# Patient Record
Sex: Female | Born: 1935 | Race: White | Hispanic: No | Smoking: Former smoker
Health system: Southern US, Community
[De-identification: ages and names within clinical notes are randomized; demographics above are authoritative.]

## PROBLEM LIST (undated history)

## (undated) DIAGNOSIS — Z780 Asymptomatic menopausal state: Secondary | ICD-10-CM

## (undated) DIAGNOSIS — E119 Type 2 diabetes mellitus without complications: Secondary | ICD-10-CM

## (undated) DIAGNOSIS — M810 Age-related osteoporosis without current pathological fracture: Secondary | ICD-10-CM

## (undated) DIAGNOSIS — M1380 Other specified arthritis, unspecified site: Secondary | ICD-10-CM

## (undated) DIAGNOSIS — E785 Hyperlipidemia, unspecified: Secondary | ICD-10-CM

## (undated) DIAGNOSIS — H04129 Dry eye syndrome of unspecified lacrimal gland: Secondary | ICD-10-CM

## (undated) DIAGNOSIS — M1990 Unspecified osteoarthritis, unspecified site: Secondary | ICD-10-CM

## (undated) DIAGNOSIS — I10 Essential (primary) hypertension: Secondary | ICD-10-CM

## (undated) HISTORY — PX: ABDOMINAL HYSTERECTOMY: SHX81

## (undated) HISTORY — DX: Age-related osteoporosis without current pathological fracture: M81.0

## (undated) HISTORY — DX: Dry eye syndrome of unspecified lacrimal gland: H04.129

## (undated) HISTORY — PX: CAPSULOTOMY: SHX379

## (undated) HISTORY — DX: Unspecified osteoarthritis, unspecified site: M19.90

## (undated) HISTORY — PX: APPENDECTOMY: SHX54

## (undated) HISTORY — DX: Asymptomatic menopausal state: Z78.0

## (undated) HISTORY — DX: Other specified arthritis, unspecified site: M13.80

## (undated) HISTORY — DX: Hyperlipidemia, unspecified: E78.5

## (undated) HISTORY — DX: Type 2 diabetes mellitus without complications: E11.9

---

## 2004-03-28 ENCOUNTER — Ambulatory Visit: Payer: Self-pay | Admitting: Internal Medicine

## 2004-10-29 ENCOUNTER — Other Ambulatory Visit: Payer: Self-pay

## 2004-10-29 ENCOUNTER — Ambulatory Visit: Payer: Self-pay | Admitting: Orthopedic Surgery

## 2004-11-04 ENCOUNTER — Ambulatory Visit: Payer: Self-pay | Admitting: Orthopedic Surgery

## 2004-11-18 ENCOUNTER — Ambulatory Visit: Payer: Self-pay | Admitting: Orthopedic Surgery

## 2006-02-17 ENCOUNTER — Ambulatory Visit: Payer: Self-pay | Admitting: Internal Medicine

## 2007-03-24 ENCOUNTER — Ambulatory Visit: Payer: Self-pay | Admitting: Internal Medicine

## 2007-07-01 ENCOUNTER — Ambulatory Visit: Payer: Self-pay | Admitting: Internal Medicine

## 2007-08-12 ENCOUNTER — Ambulatory Visit: Payer: Self-pay | Admitting: Internal Medicine

## 2008-01-18 ENCOUNTER — Ambulatory Visit: Payer: Self-pay | Admitting: Internal Medicine

## 2008-04-12 ENCOUNTER — Ambulatory Visit: Payer: Self-pay | Admitting: Internal Medicine

## 2008-08-24 HISTORY — PX: CATARACT EXTRACTION W/PHACO: SHX586

## 2009-01-18 HISTORY — PX: CATARACT EXTRACTION W/PHACO: SHX586

## 2009-06-26 ENCOUNTER — Ambulatory Visit: Payer: Self-pay | Admitting: Internal Medicine

## 2009-07-13 ENCOUNTER — Ambulatory Visit: Payer: Self-pay | Admitting: Internal Medicine

## 2009-07-18 ENCOUNTER — Ambulatory Visit: Payer: Self-pay | Admitting: Internal Medicine

## 2009-07-24 ENCOUNTER — Ambulatory Visit: Payer: Self-pay | Admitting: Internal Medicine

## 2009-07-26 ENCOUNTER — Ambulatory Visit: Payer: Self-pay | Admitting: Internal Medicine

## 2009-08-09 ENCOUNTER — Ambulatory Visit: Payer: Self-pay | Admitting: Unknown Physician Specialty

## 2009-08-14 ENCOUNTER — Ambulatory Visit: Payer: Self-pay | Admitting: Unknown Physician Specialty

## 2009-08-24 ENCOUNTER — Ambulatory Visit: Payer: Self-pay | Admitting: Internal Medicine

## 2009-08-29 ENCOUNTER — Ambulatory Visit: Payer: Self-pay | Admitting: Internal Medicine

## 2009-11-23 ENCOUNTER — Ambulatory Visit: Payer: Self-pay | Admitting: Internal Medicine

## 2009-11-29 ENCOUNTER — Ambulatory Visit: Payer: Self-pay | Admitting: Cardiothoracic Surgery

## 2009-12-18 ENCOUNTER — Ambulatory Visit: Payer: Self-pay | Admitting: Internal Medicine

## 2009-12-24 ENCOUNTER — Ambulatory Visit: Payer: Self-pay | Admitting: Internal Medicine

## 2010-07-17 ENCOUNTER — Ambulatory Visit: Payer: Self-pay | Admitting: Internal Medicine

## 2010-07-25 ENCOUNTER — Ambulatory Visit: Payer: Self-pay | Admitting: Internal Medicine

## 2010-08-25 ENCOUNTER — Ambulatory Visit: Payer: Self-pay | Admitting: Internal Medicine

## 2010-09-24 ENCOUNTER — Ambulatory Visit: Payer: Self-pay | Admitting: Internal Medicine

## 2011-06-09 ENCOUNTER — Ambulatory Visit: Payer: Self-pay | Admitting: Internal Medicine

## 2012-02-09 ENCOUNTER — Ambulatory Visit: Payer: Self-pay | Admitting: Internal Medicine

## 2014-08-12 ENCOUNTER — Emergency Department: Payer: Self-pay | Admitting: Emergency Medicine

## 2015-06-29 ENCOUNTER — Emergency Department
Admission: EM | Admit: 2015-06-29 | Discharge: 2015-06-30 | Disposition: A | Discharge status: Home / Self Care | Payer: Medicare Other | Attending: Emergency Medicine | Admitting: Emergency Medicine

## 2015-06-29 ENCOUNTER — Encounter: Payer: Self-pay | Admitting: Emergency Medicine

## 2015-06-29 DIAGNOSIS — R51 Headache: Secondary | ICD-10-CM | POA: Insufficient documentation

## 2015-06-29 DIAGNOSIS — Z79899 Other long term (current) drug therapy: Secondary | ICD-10-CM | POA: Diagnosis not present

## 2015-06-29 DIAGNOSIS — Z7982 Long term (current) use of aspirin: Secondary | ICD-10-CM | POA: Insufficient documentation

## 2015-06-29 DIAGNOSIS — E860 Dehydration: Secondary | ICD-10-CM | POA: Diagnosis not present

## 2015-06-29 DIAGNOSIS — Z87891 Personal history of nicotine dependence: Secondary | ICD-10-CM | POA: Diagnosis not present

## 2015-06-29 DIAGNOSIS — R519 Headache, unspecified: Secondary | ICD-10-CM

## 2015-06-29 DIAGNOSIS — R112 Nausea with vomiting, unspecified: Secondary | ICD-10-CM

## 2015-06-29 DIAGNOSIS — R42 Dizziness and giddiness: Secondary | ICD-10-CM | POA: Insufficient documentation

## 2015-06-29 DIAGNOSIS — M549 Dorsalgia, unspecified: Secondary | ICD-10-CM | POA: Insufficient documentation

## 2015-06-29 DIAGNOSIS — I10 Essential (primary) hypertension: Secondary | ICD-10-CM | POA: Diagnosis not present

## 2015-06-29 HISTORY — DX: Essential (primary) hypertension: I10

## 2015-06-29 LAB — CBC
HCT: 40.7 % (ref 35.0–47.0)
Hemoglobin: 13.9 g/dL (ref 12.0–16.0)
MCH: 30.8 pg (ref 26.0–34.0)
MCHC: 34.1 g/dL (ref 32.0–36.0)
MCV: 90.5 fL (ref 80.0–100.0)
PLATELETS: 218 10*3/uL (ref 150–440)
RBC: 4.5 MIL/uL (ref 3.80–5.20)
RDW: 13.3 % (ref 11.5–14.5)
WBC: 9.4 10*3/uL (ref 3.6–11.0)

## 2015-06-29 LAB — COMPREHENSIVE METABOLIC PANEL
ALBUMIN: 4.5 g/dL (ref 3.5–5.0)
ALK PHOS: 74 U/L (ref 38–126)
ALT: 22 U/L (ref 14–54)
AST: 26 U/L (ref 15–41)
Anion gap: 9 (ref 5–15)
BUN: 22 mg/dL — AB (ref 6–20)
CALCIUM: 10 mg/dL (ref 8.9–10.3)
CO2: 26 mmol/L (ref 22–32)
CREATININE: 0.51 mg/dL (ref 0.44–1.00)
Chloride: 102 mmol/L (ref 101–111)
GFR calc Af Amer: >60 mL/min (ref >60)
GFR calc non Af Amer: >60 mL/min (ref >60)
GLUCOSE: 139 mg/dL — AB (ref 65–99)
Potassium: 3.8 mmol/L (ref 3.5–5.1)
SODIUM: 137 mmol/L (ref 135–145)
Total Bilirubin: 1.2 mg/dL (ref 0.3–1.2)
Total Protein: 7.6 g/dL (ref 6.5–8.1)

## 2015-06-29 LAB — TROPONIN I

## 2015-06-29 LAB — LIPASE, BLOOD: Lipase: 37 U/L (ref 11–51)

## 2015-06-29 MED ORDER — SODIUM CHLORIDE 0.9 % IV BOLUS (SEPSIS)
1000.0000 mL | Freq: Once | INTRAVENOUS | Status: AC
  Administered 2015-06-29: 1000 mL via INTRAVENOUS

## 2015-06-29 NOTE — ED Notes (Addendum)
Pt presents to ED via EMS from [facility] with c/o of nausea and vomiting sx. EMS states pt experienced presenting sx this evening approx. 4-6 hrs ago. EMS states pt had x1 vomiting episode today. Pt denies abdominal pain. Pt states she has had accompanying dizziness,light-headedness, and heart burn sx. Pt was given 4 mg of Zofran prior to ED arrival by EMS. Pt arrived to ER alert and oriented x4.

## 2015-06-30 ENCOUNTER — Emergency Department: Payer: Medicare Other

## 2015-06-30 LAB — URINALYSIS COMPLETE WITH MICROSCOPIC (ARMC ONLY)
BILIRUBIN URINE: NEGATIVE
Bacteria, UA: NONE SEEN
GLUCOSE, UA: NEGATIVE mg/dL
HGB URINE DIPSTICK: NEGATIVE
NITRITE: NEGATIVE
PH: 6 (ref 5.0–8.0)
Protein, ur: NEGATIVE mg/dL
SPECIFIC GRAVITY, URINE: 1.019 (ref 1.005–1.030)

## 2015-06-30 LAB — TROPONIN I

## 2015-06-30 LAB — BRAIN NATRIURETIC PEPTIDE: B Natriuretic Peptide: 44 pg/mL (ref 0.0–100.0)

## 2015-06-30 MED ORDER — BUTALBITAL-APAP-CAFFEINE 50-325-40 MG PO TABS
1.0000 | ORAL_TABLET | Freq: Once | ORAL | Status: AC
  Administered 2015-06-30: 1 via ORAL
  Filled 2015-06-30: qty 1

## 2015-06-30 MED ORDER — MECLIZINE HCL 25 MG PO TABS: 25.0000 mg | ORAL_TABLET | Freq: Three times a day (TID) | ORAL | Status: DC | PRN

## 2015-06-30 MED ORDER — MECLIZINE HCL 25 MG PO TABS
25.0000 mg | ORAL_TABLET | Freq: Once | ORAL | Status: AC
  Administered 2015-06-30: 25 mg via ORAL
  Filled 2015-06-30: qty 1

## 2015-06-30 MED ORDER — METOCLOPRAMIDE HCL 5 MG/ML IJ SOLN
10.0000 mg | Freq: Once | INTRAMUSCULAR | Status: AC
  Administered 2015-06-30: 10 mg via INTRAVENOUS
  Filled 2015-06-30: qty 2

## 2015-06-30 MED ORDER — BUTALBITAL-APAP-CAFFEINE 50-325-40 MG PO TABS: 2.0000 | ORAL_TABLET | Freq: Once | ORAL | Status: DC

## 2015-06-30 MED ORDER — DIPHENHYDRAMINE HCL 50 MG/ML IJ SOLN
25.0000 mg | Freq: Once | INTRAMUSCULAR | Status: AC
Start: 2015-06-30 — End: 2015-06-30
  Administered 2015-06-30: 25 mg via INTRAVENOUS
  Filled 2015-06-30: qty 1

## 2015-06-30 NOTE — Discharge Instructions (Signed)
Dizziness Dizziness is a common problem. It is a feeling of unsteadiness or light-headedness. You may feel like you are about to faint. Dizziness can lead to injury if you stumble or fall. Anyone can become dizzy, but dizziness is more common in older adults. This condition can be caused by a number of things, including medicines, dehydration, or illness. HOME CARE INSTRUCTIONS Taking these steps may help with your condition: Eating and Drinking  Drink enough fluid to keep your urine clear or pale yellow. This helps to keep you from becoming dehydrated. Try to drink more clear fluids, such as water.  Do not drink alcohol.  Limit your caffeine intake if directed by your health care provider.  Limit your salt intake if directed by your health care provider. Activity  Avoid making quick movements.  Rise slowly from chairs and steady yourself until you feel okay.  In the morning, first sit up on the side of the bed. When you feel okay, stand slowly while you hold onto something until you know that your balance is fine.  Move your legs often if you need to stand in one place for a long time. Tighten and relax your muscles in your legs while you are standing.  Do not drive or operate heavy machinery if you feel dizzy.  Avoid bending down if you feel dizzy. Place items in your home so that they are easy for you to reach without leaning over. Lifestyle  Do not use any tobacco products, including cigarettes, chewing tobacco, or electronic cigarettes. If you need help quitting, ask your health care provider.  Try to reduce your stress level, such as with yoga or meditation. Talk with your health care provider if you need help. General Instructions  Watch your dizziness for any changes.  Take medicines only as directed by your health care provider. Talk with your health care provider if you think that your dizziness is caused by a medicine that you are taking.  Tell a friend or a family  member that you are feeling dizzy. If he or she notices any changes in your behavior, have this person call your health care provider.  Keep all follow-up visits as directed by your health care provider. This is important. SEEK MEDICAL CARE IF:  Your dizziness does not go away.  Your dizziness or light-headedness gets worse.  You feel nauseous.  You have reduced hearing.  You have new symptoms.  You are unsteady on your feet or you feel like the room is spinning. SEEK IMMEDIATE MEDICAL CARE IF:  You vomit or have diarrhea and are unable to eat or drink anything.  You have problems talking, walking, swallowing, or using your arms, hands, or legs.  You feel generally weak.  You are not thinking clearly or you have trouble forming sentences. It may take a friend or family member to notice this.  You have chest pain, abdominal pain, shortness of breath, or sweating.  Your vision changes.  You notice any bleeding.  You have a headache.  You have neck pain or a stiff neck.  You have a fever.   This information is not intended to replace advice given to you by your health care provider. Make sure you discuss any questions you have with your health care provider.   Document Released: 11/05/2000 Document Revised: 09/26/2014 Document Reviewed: 05/08/2014 Elsevier Interactive Patient Education 2016 [place] Headache Without Cause A headache is pain or discomfort felt around the head or neck area. The  specific cause of a headache may not be found. There are many causes and types of headaches. A few common ones are:  Tension headaches.  Migraine headaches.  Cluster headaches.  Chronic daily headaches. HOME CARE INSTRUCTIONS  Watch your condition for any changes. Take these steps to help with your condition: Managing Pain  Take over-the-counter and prescription medicines only as told by your health care provider.  Lie down in a dark, quiet room when you  have a headache.  If directed, apply ice to the head and neck area:  Put ice in a plastic bag.  Place a towel between your skin and the bag.  Leave the ice on for 20 minutes, 2-3 times per day.  Use a heating pad or hot shower to apply heat to the head and neck area as told by your health care provider.  Keep lights dim if bright lights bother you or make your headaches worse. Eating and Drinking  Eat meals on a regular schedule.  Limit alcohol use.  Decrease the amount of caffeine you drink, or stop drinking caffeine. General Instructions  Keep all follow-up visits as told by your health care provider. This is important.  Keep a headache journal to help find out what may trigger your headaches. For example, write down:  What you eat and drink.  How much sleep you get.  Any change to your diet or medicines.  Try massage or other relaxation techniques.  Limit stress.  Sit up straight, and do not tense your muscles.  Do not use tobacco products, including cigarettes, chewing tobacco, or e-cigarettes. If you need help quitting, ask your health care provider.  Exercise regularly as told by your health care provider.  Sleep on a regular schedule. Get 7-9 hours of sleep, or the amount recommended by your health care provider. SEEK MEDICAL CARE IF:   Your symptoms are not helped by medicine.  You have a headache that is different from the usual headache.  You have nausea or you vomit.  You have a fever. SEEK IMMEDIATE MEDICAL CARE IF:   Your headache becomes severe.  You have repeated vomiting.  You have a stiff neck.  You have a loss of vision.  You have problems with speech.  You have pain in the eye or ear.  You have muscular weakness or loss of muscle control.  You lose your balance or have trouble walking.  You feel faint or pass out.  You have confusion.   This information is not intended to replace advice given to you by your health care  provider. Make sure you discuss any questions you have with your health care provider.   Document Released: 05/12/2005 Document Revised: 01/31/2015 Document Reviewed: 09/04/2014 Elsevier Interactive Patient Education 2016 [employer]  Nausea and Vomiting Nausea is a sick feeling that often comes before throwing up (vomiting). Vomiting is a reflex where stomach contents come out of your mouth. Vomiting can cause severe loss of body fluids (dehydration). Children and elderly adults can become dehydrated quickly, especially if they also have diarrhea. Nausea and vomiting are symptoms of a condition or disease. It is important to find the cause of your symptoms. CAUSES   Direct irritation of the stomach lining. This irritation can result from increased acid production (gastroesophageal reflux disease), infection, food poisoning, taking certain medicines (such as nonsteroidal anti-inflammatory drugs), alcohol use, or tobacco use.  Signals from the brain.These signals could be caused by a headache, heat exposure, an inner ear disturbance,  increased pressure in the brain from injury, infection, a tumor, or a concussion, pain, emotional stimulus, or metabolic problems.  An obstruction in the gastrointestinal tract (bowel obstruction).  Illnesses such as diabetes, hepatitis, gallbladder problems, appendicitis, kidney problems, cancer, sepsis, atypical symptoms of a heart attack, or eating disorders.  Medical treatments such as chemotherapy and radiation.  Receiving medicine that makes you sleep (general anesthetic) during surgery. DIAGNOSIS Your caregiver may ask for tests to be done if the problems do not improve after a few days. Tests may also be done if symptoms are severe or if the reason for the nausea and vomiting is not clear. Tests may include:  Urine tests.  Blood tests.  Stool tests.  Cultures (to look for evidence of infection).  X-rays or other imaging studies. Test results  can help your caregiver make decisions about treatment or the need for additional tests. TREATMENT You need to stay well hydrated. Drink frequently but in small amounts.You may wish to drink water, sports drinks, clear broth, or eat frozen ice pops or gelatin dessert to help stay hydrated.When you eat, eating slowly may help prevent nausea.There are also some antinausea medicines that may help prevent nausea. HOME CARE INSTRUCTIONS   Take all medicine as directed by your caregiver.  If you do not have an appetite, do not force yourself to eat. However, you must continue to drink fluids.  If you have an appetite, eat a normal diet unless your caregiver tells you differently.  Eat a variety of complex carbohydrates (rice, wheat, potatoes, bread), lean meats, yogurt, fruits, and vegetables.  Avoid high-fat foods because they are more difficult to digest.  Drink enough water and fluids to keep your urine clear or pale yellow.  If you are dehydrated, ask your caregiver for specific rehydration instructions. Signs of dehydration may include:  Severe thirst.  Dry lips and mouth.  Dizziness.  Dark urine.  Decreasing urine frequency and amount.  Confusion.  Rapid breathing or pulse. SEEK IMMEDIATE MEDICAL CARE IF:   You have blood or brown flecks (like coffee grounds) in your vomit.  You have black or bloody stools.  You have a severe headache or stiff neck.  You are confused.  You have severe abdominal pain.  You have chest pain or trouble breathing.  You do not urinate at least once every 8 hours.  You develop cold or clammy skin.  You continue to vomit for longer than 24 to 48 hours.  You have a fever. MAKE SURE YOU:   Understand these instructions.  Will watch your condition.  Will get help right away if you are not doing well or get worse.   This information is not intended to replace advice given to you by your health care provider. Make sure you discuss  any questions you have with your health care provider.   Document Released: 05/12/2005 Document Revised: 08/04/2011 Document Reviewed: 10/09/2010 Elsevier Interactive Patient Education [employer].

## 2015-06-30 NOTE — ED Notes (Signed)
Pt verbalized understanding of discharge instructions and [facility] Nurse called. NAD at this time.

## 2015-06-30 NOTE — ED Provider Notes (Signed)
[facility] Emergency Department Provider Note  ____________________________________________  Time seen: Approximately 2346 AM  I have reviewed the triage vital signs and the nursing notes.   HISTORY  Chief Complaint Nausea and Emesis  Patient with history of dementia  HPI Katie Logan is a 82 y.o. female who comes into the hospital today with vomiting and nausea. The patient per EMS started having some nausea and vomiting this evening. Per them she vomited once but then felt dizzy and lightheaded. The patient currently herself is reporting some headache and back pain. She is complaining that she's been here so long that she has now developed a headache as well as some back pain and she is angry. The patient reports she was sent here due to vomiting and she vomited early this morning but is not vomiting anymore. She reports that she is sick and being in the hospital has made her sicker. She reports that her headache is a 10 out of 10 and she now feels worse and she did when she first came in. The patient denies any abdominal pain and reports that her vomiting was more liquid than anything. She reports that she does continue to have some indigestion and feels as though there is something going on in her chest. The patient continually reports that she is angry and frustrated because the bed is uncomfortable. She reports that she just wants to know what's going on. She denies a previous history of headaches.   Past Medical History  Diagnosis Date  . Hypertension     There are no active problems to display for this patient.   Past Surgical History  Procedure Laterality Date  . Appendectomy      Current Outpatient Rx  Name  Route  Sig  Dispense  Refill  . aspirin EC 81 MG tablet   Oral   Take 81 mg by mouth daily.         Marland Kitchen atorvastatin (LIPITOR) 20 MG tablet   Oral   Take 20 mg by mouth daily.         . calcium-vitamin D (OSCAL WITH D) 500-200  MG-UNIT tablet   Oral   Take 2 tablets by mouth daily with breakfast.         . citalopram (CELEXA) 20 MG tablet   Oral   Take 20 mg by mouth daily.         . enalapril (VASOTEC) 10 MG tablet   Oral   Take 10 mg by mouth 2 (two) times daily.         . hydrochlorothiazide (HYDRODIURIL) 25 MG tablet   Oral   Take 25 mg by mouth 2 (two) times daily.         . potassium chloride SA (K-DUR,KLOR-CON) 20 MEQ tablet   Oral   Take 20 mEq by mouth daily.         . meclizine (ANTIVERT) 25 MG tablet   Oral   Take 1 tablet (25 mg total) by mouth 3 (three) times daily as needed for dizziness.   15 tablet   0     Allergies Review of patient's allergies indicates no known allergies.  History reviewed. No pertinent family history.  Social History Social History  Substance Use Topics  . Smoking status: Former Research scientist (life sciences)  . Smokeless tobacco: None  . Alcohol Use: No    Review of Systems Constitutional: No fever/chills Eyes: No visual changes. ENT: No sore throat. Cardiovascular: Denies chest pain. Respiratory:  Denies shortness of breath. Gastrointestinal: Nausea and vomiting with no diarrhea Genitourinary: Negative for dysuria. Musculoskeletal:  back pain. Skin: Negative for rash. Neurological: Headache lightheadedness and dizziness  10-point ROS otherwise negative.  ____________________________________________   PHYSICAL EXAM:  VITAL SIGNS: ED Triage Vitals  Enc Vitals Group     BP 06/29/15 2216 162/88 mmHg     Pulse Rate 06/29/15 2216 81     Resp 06/29/15 2216 13     Temp 06/29/15 2216 98 F (36.7 C)     Temp Source 06/29/15 2216 Oral     SpO2 06/29/15 2216 95 %     Weight 06/29/15 2216 140 lb (63.504 kg)     Height 06/29/15 2216 5\' 1"  (1.549 m)     Head Cir --      Peak Flow --      Pain Score 06/29/15 2217 4     Pain Loc --      Pain Edu? --      Excl. in [place]? --     Constitutional: Alert and oriented. Well appearing and in moderate  distress. Eyes: Conjunctivae are normal. PERRL. EOMI. Head: Atraumatic. Nose: No congestion/rhinnorhea. Mouth/Throat: Mucous membranes are moist.  Oropharynx non-erythematous. Neck: No cervical spine tenderness to palpation. Cardiovascular: Normal rate, regular rhythm. Grossly normal heart sounds.  Good peripheral circulation. Respiratory: Normal respiratory effort.  No retractions. Crackles at both bases and anteriorly Gastrointestinal: Soft and nontender. No distention. Positive bowel sounds Musculoskeletal: No lower extremity tenderness nor edema.  . Neurologic:  Normal speech and language. No gross focal neurologic deficits are appreciated. Cranial nerves II through XII are grossly intact Skin:  Skin is warm, dry and intact.  Psychiatric: Mood and affect are normal.   ____________________________________________   LABS (all labs ordered are listed, but only abnormal results are displayed)  Labs Reviewed  COMPREHENSIVE METABOLIC PANEL - Abnormal; Notable for the following:    Glucose, Bld 139 (*)    BUN 22 (*)    All other components within normal limits  URINALYSIS COMPLETEWITH MICROSCOPIC ([facility] ONLY) - Abnormal; Notable for the following:    Color, Urine YELLOW (*)    APPearance CLEAR (*)    Ketones, ur TRACE (*)    Leukocytes, UA TRACE (*)    Squamous Epithelial / LPF 0-5 (*)    All other components within normal limits  LIPASE, BLOOD  CBC  TROPONIN I  BRAIN NATRIURETIC PEPTIDE  TROPONIN I   ____________________________________________  EKG  ED ECG REPORT I, Loney Hering, the attending physician, personally viewed and interpreted this ECG.   Date: 06/30/2015  EKG Time: 2213  Rate: 83  Rhythm: normal sinus rhythm  Axis: normal  Intervals:none  ST&T Change: none  ____________________________________________  RADIOLOGY  CXR: Borderline hyperinflation with coarse interstitial markings, suspect this is chronic lung disease, however appears progressed  compared to CT 2011  KUB: Negative  CT head and cervical spine: Unremarkable head CT for age ____________________________________________   PROCEDURES  Procedure(s) performed: None  Critical Care performed: No  ____________________________________________   INITIAL IMPRESSION / ASSESSMENT AND PLAN / ED COURSE  Pertinent labs & imaging results that were available during my care of the patient were reviewed by me and considered in my medical decision making (see chart for details).  This is an 82 year old female who comes into the hospital today with some nausea and vomiting. The patient reports that being here for so long she has now developed a headache and back pain. The  nurse reports that he did do orthostatics on the patient and the patient was orthostatic and became tachycardic after standing to the 120s. I will give the patient some gentle hydration and sent her for CT scan of her head given the headache and the vomiting as well as a KUB of her abdomen. I will give the patient some Reglan and Benadryl and reassess the patient once I received the results of her imaging.  The patient did receive some normal saline and her headache had some mild improvement with the Reglan and Benadryl. I didn't give the patient some Fioricet to help completely take away the headache. The patient was then walking around her room and said she did still feel some mild dizziness I will give her some meclizine to help with that. As the patient received the meclizine she was sleeping well and did not have any difficulty. I asked the patient if her daughter would take her home or if she would need an ambulance and the patient reports that her daughter would drive down from [place] to come and pick her up to take her home. The patient is currently sleeping in no acute distress. ____________________________________________   FINAL CLINICAL IMPRESSION(S) / ED DIAGNOSES  Final diagnoses:  Acute nonintractable  headache, unspecified headache type  Dizziness  Non-intractable vomiting with nausea, vomiting of unspecified type  Dehydration      Loney Hering, MD 06/30/15 [phone]

## 2016-10-28 LAB — HEMOGLOBIN A1C: HEMOGLOBIN A1C: 6.9

## 2016-10-28 LAB — TSH: TSH: 1.55 (ref 0.41–5.90)

## 2016-10-30 ENCOUNTER — Other Ambulatory Visit: Payer: Self-pay | Admitting: Internal Medicine

## 2016-10-30 DIAGNOSIS — Z1231 Encounter for screening mammogram for malignant neoplasm of breast: Secondary | ICD-10-CM

## 2016-11-06 ENCOUNTER — Emergency Department
Admission: EM | Admit: 2016-11-06 | Discharge: 2016-11-06 | Disposition: A | Discharge status: Home / Self Care | Payer: Medicare Other | Attending: Emergency Medicine | Admitting: Emergency Medicine

## 2016-11-06 ENCOUNTER — Emergency Department: Payer: Medicare Other

## 2016-11-06 ENCOUNTER — Encounter: Payer: Self-pay | Admitting: Emergency Medicine

## 2016-11-06 DIAGNOSIS — Z87891 Personal history of nicotine dependence: Secondary | ICD-10-CM | POA: Diagnosis not present

## 2016-11-06 DIAGNOSIS — I10 Essential (primary) hypertension: Secondary | ICD-10-CM | POA: Diagnosis not present

## 2016-11-06 DIAGNOSIS — F0390 Unspecified dementia without behavioral disturbance: Secondary | ICD-10-CM | POA: Diagnosis not present

## 2016-11-06 DIAGNOSIS — R911 Solitary pulmonary nodule: Secondary | ICD-10-CM | POA: Insufficient documentation

## 2016-11-06 DIAGNOSIS — N3000 Acute cystitis without hematuria: Secondary | ICD-10-CM | POA: Diagnosis not present

## 2016-11-06 DIAGNOSIS — E119 Type 2 diabetes mellitus without complications: Secondary | ICD-10-CM | POA: Diagnosis not present

## 2016-11-06 DIAGNOSIS — R079 Chest pain, unspecified: Secondary | ICD-10-CM | POA: Diagnosis present

## 2016-11-06 HISTORY — DX: Type 2 diabetes mellitus without complications: E11.9

## 2016-11-06 LAB — BASIC METABOLIC PANEL
ANION GAP: 10 (ref 5–15)
BUN: 34 mg/dL — ABNORMAL HIGH (ref 6–20)
CALCIUM: 10.3 mg/dL (ref 8.9–10.3)
CO2: 19 mmol/L — ABNORMAL LOW (ref 22–32)
Chloride: 103 mmol/L (ref 101–111)
Creatinine, Ser: 0.7 mg/dL (ref 0.44–1.00)
Glucose, Bld: 159 mg/dL — ABNORMAL HIGH (ref 65–99)
POTASSIUM: 4 mmol/L (ref 3.5–5.1)
SODIUM: 132 mmol/L — AB (ref 135–145)

## 2016-11-06 LAB — URINALYSIS, COMPLETE (UACMP) WITH MICROSCOPIC
Bilirubin Urine: NEGATIVE
GLUCOSE, UA: NEGATIVE mg/dL
HGB URINE DIPSTICK: NEGATIVE
KETONES UR: NEGATIVE mg/dL
Nitrite: NEGATIVE
PH: 5 (ref 5.0–8.0)
Protein, ur: NEGATIVE mg/dL
RBC / HPF: NONE SEEN RBC/hpf (ref 0–5)
Specific Gravity, Urine: 1.035 — ABNORMAL HIGH (ref 1.005–1.030)

## 2016-11-06 LAB — CBC
HEMATOCRIT: 37.8 % (ref 35.0–47.0)
HEMOGLOBIN: 12.9 g/dL (ref 12.0–16.0)
MCH: 30.1 pg (ref 26.0–34.0)
MCHC: 34.2 g/dL (ref 32.0–36.0)
MCV: 88.1 fL (ref 80.0–100.0)
Platelets: 317 10*3/uL (ref 150–440)
RBC: 4.29 MIL/uL (ref 3.80–5.20)
RDW: 13.3 % (ref 11.5–14.5)
WBC: 15.6 10*3/uL — AB (ref 3.6–11.0)

## 2016-11-06 LAB — TROPONIN I: Troponin I: <0.03 ng/mL (ref <0.03)

## 2016-11-06 MED ORDER — CEPHALEXIN 500 MG PO CAPS: 500.0000 mg | ORAL_CAPSULE | Freq: Four times a day (QID) | ORAL | 0 refills | Status: AC

## 2016-11-06 MED ORDER — IOPAMIDOL (ISOVUE-370) INJECTION 76%
75.0000 mL | Freq: Once | INTRAVENOUS | Status: AC | PRN
  Administered 2016-11-06: 75 mL via INTRAVENOUS
  Filled 2016-11-06: qty 75

## 2016-11-06 MED ORDER — DEXTROSE 5 % IV SOLN: 1.0000 g | Freq: Once | INTRAVENOUS | Status: DC

## 2016-11-06 MED ORDER — SODIUM CHLORIDE 0.9 % IV BOLUS (SEPSIS)
1000.0000 mL | Freq: Once | INTRAVENOUS | Status: AC
  Administered 2016-11-06: 1000 mL via INTRAVENOUS

## 2016-11-06 MED ORDER — DEXTROSE 5 % IV SOLN
1.0000 g | Freq: Once | INTRAVENOUS | Status: AC
  Administered 2016-11-06: 1 g via INTRAVENOUS
  Filled 2016-11-06: qty 10

## 2016-11-06 MED ORDER — ASPIRIN 81 MG PO CHEW
324.0000 mg | CHEWABLE_TABLET | Freq: Once | ORAL | Status: AC
  Administered 2016-11-06: 324 mg via ORAL
  Filled 2016-11-06: qty 4

## 2016-11-06 NOTE — ED Triage Notes (Signed)
Pt to ED via POV from [facility] with c/o CP x2 days. Describes pain as intermittent and radiating to back. Pt VS stable.

## 2016-11-06 NOTE — ED Notes (Signed)
Patient transported to CT 

## 2016-11-06 NOTE — Discharge Instructions (Signed)
Please make an appointment with your primary care physician and with a cardiologist for reevaluation. Today, a nodule in your left lung was seen on the CT scan, which may need reevaluation. It is likely benign (not cancerous) but does need to be monitored. Your primary care physician can help you with that.  Take the entire course of antibiotics, even if you are feeling better.  Return to the emergency department if you develop chest pain, shortness of breath, lightheadedness or fainting, palpitations, fever, or any other symptoms concerning to you.

## 2016-11-06 NOTE — ED Notes (Signed)
Called pharmacy to request medication 

## 2016-11-06 NOTE — ED Provider Notes (Signed)
[facility] Emergency Department Provider Note  ____________________________________________  Time seen: Approximately 7:36 PM  I have reviewed the triage vital signs and the nursing notes.   HISTORY  Chief Complaint Chest Pain  Limited due to patient's dementia.  HPI Katie Logan is a 83 y.o. female with a history of dementia, HTN, DM, presenting with chest pain. The patient is a poor historian and is accompanied by her caregiver from [facility].Yesterday, while the patient was lying in bed, she complained of a substernal "pressure" that did not radiate and was not associated with any shortness of breath, palpitations, syncope, diaphoresis, nausea or vomiting. She states the pain has been persistent since then, but now the "pain" is gone but she still has a "dull ache." This is worse if she takes deep breaths. She does not notice any difference with exertion. She has also had one month of postural lightheadedness.  Her caregiver also states that she has had some mild increase in her confusion, so she went to urgent care today and has a paper which shows a UA with positive nitrites and white blood cells. Denies any dysuria, fever or chills, nausea or vomiting   Past Medical History:  Diagnosis Date  . Diabetes mellitus without complication ([place])   . Hypertension     There are no active problems to display for this patient.   Past Surgical History:  Procedure Laterality Date  . APPENDECTOMY      Current Outpatient Rx  . Order #: [id number] Class: Historical Med  . Order #: [id number] Class: Historical Med  . Order #: [id number] Class: Historical Med  . Order #: [id number] Class: Print  . Order #: [id number] Class: Historical Med  . Order #: [id number] Class: Historical Med  . Order #: [id number] Class: Historical Med  . Order #: [id number] Class: Print  . Order #: [id number] Class: Historical Med    Allergies Patient has no known  allergies.  No family history on file.  Social History Social History  Substance Use Topics  . Smoking status: Former Research scientist (life sciences)  . Smokeless tobacco: Never Used  . Alcohol use No    Review of Systems Constitutional: No fever/chills.Slight increase in her baseline confusion. Eyes: No visual changes. ENT: No sore throat. No congestion or rhinorrhea. Cardiovascular: Positive chest pain. Denies palpitations. Respiratory: Denies shortness of breath.  No cough. Gastrointestinal: No abdominal pain.  No nausea, no vomiting.  No diarrhea.  No constipation. Genitourinary: Negative for dysuria. Musculoskeletal: Negative for back pain. Skin: Negative for rash. Neurological: Negative for headaches. No focal numbness, tingling or weakness.     ____________________________________________   PHYSICAL EXAM:  VITAL SIGNS: ED Triage Vitals  Enc Vitals Group     BP 11/06/16 1746 (!) 151/80     Pulse Rate 11/06/16 1746 77     Resp 11/06/16 1746 16     Temp 11/06/16 1746 98.1 F (36.7 C)     Temp Source 11/06/16 1746 Oral     SpO2 11/06/16 1746 98 %     Weight 11/06/16 1746 140 lb (63.5 kg)     Height 11/06/16 1746 4\' 11"  (1.499 m)     Head Circumference --      Peak Flow --      Pain Score 11/06/16 1918 7     Pain Loc --      Pain Edu? --      Excl. in [place]? --     Constitutional: Alert and oriented. Well appearing and in no acute distress. Answers questions  appropriately. Eyes: Conjunctivae are normal.  EOMI. No scleral icterus. Head: Atraumatic. Nose: No congestion/rhinnorhea. Mouth/Throat: Mucous membranes are moist.  Neck: No stridor.  Supple.  No JVD. Cardiovascular: Normal rate, regular rhythm. No murmurs, rubs or gallops.  Respiratory: Normal respiratory effort.  No accessory muscle use or retractions. Lungs CTAB.  No wheezes, rales or ronchi. Gastrointestinal: Soft, nontender and nondistended.  No guarding or rebound.  No peritoneal signs. Musculoskeletal: No LE edema. No  ttp in the calves or palpable cords.  Negative Homan's sign. Neurologic:  alert but does exhibit signs of dementia with repetitive questioning.Marland Kitchen  Speech is clear.  Face and smile are symmetric.  EOMI.  Moves all extremities well. Skin:  Skin is warm, dry and intact. No rash noted. Psychiatric: Mood and affect are normal.   ____________________________________________   LABS (all labs ordered are listed, but only abnormal results are displayed)  Labs Reviewed  BASIC METABOLIC PANEL - Abnormal; Notable for the following:       Result Value   Sodium 132 (*)    CO2 19 (*)    Glucose, Bld 159 (*)    BUN 34 (*)    All other components within normal limits  CBC - Abnormal; Notable for the following:    WBC 15.6 (*)    All other components within normal limits  URINE CULTURE  TROPONIN I  TROPONIN I  URINALYSIS, COMPLETE (UACMP) WITH MICROSCOPIC   ____________________________________________  EKG  ED ECG REPORT I, Eula Listen, the attending physician, personally viewed and interpreted this ECG.   Date: 11/06/2016  EKG Time: 1745  Rate: 86  Rhythm: normal sinus rhythm  Axis: normal  Intervals:none  ST&T Change: No STEMI  ____________________________________________  RADIOLOGY  Dg Chest 2 View  Result Date: 11/06/2016 CLINICAL DATA:  Chest pain radiating into the back for the past 2 days intermittently. EXAM: CHEST  2 VIEW COMPARISON:  06/30/2015. FINDINGS: Normal sized heart. Clear lungs. Stable prominence of the interstitial markings with no pleural fluid. Unremarkable bones. IMPRESSION: No acute abnormality.  Stable chronic interstitial lung disease. Electronically Signed   By: Claudie Revering M.D.   On: 11/06/2016 18:20   Ct Angio Chest Pe W Or Wo Contrast  Result Date: 11/06/2016 CLINICAL DATA:  Chest pain for 2 days. EXAM: CT ANGIOGRAPHY CHEST WITH CONTRAST TECHNIQUE: Multidetector CT imaging of the chest was performed using the standard protocol during bolus  administration of intravenous contrast. Multiplanar CT image reconstructions and MIPs were obtained to evaluate the vascular anatomy. CONTRAST:  75 cc Isovue 370 COMPARISON:  11/29/2009 FINDINGS: Cardiovascular: Heart size upper normal. No pericardial effusion. Coronary artery calcification is noted. Atherosclerotic calcification is noted in the wall of the thoracic aorta. No filling defect within the opacified pulmonary arteries to indicate the presence of an acute pulmonary embolus. Mediastinum/Nodes: Small lymph nodes are seen scattered in the mediastinum without lymphadenopathy. Calcified lymph nodes are evident in the right hilum. There is no hilar lymphadenopathy. The esophagus has normal imaging features. Tiny hiatal hernia noted. There is no axillary lymphadenopathy. Lungs/Pleura: Chronic interstitial changes are noted in the lung bases similar to prior. Associated peripheral lower lobe bronchiectasis is similar. 2.2 cm nodule in the superior segment left lower lobe measured 1.5 cm on the prior study. No pulmonary edema or pleural effusion. No focal airspace consolidation to suggest pneumonia. Upper Abdomen: 3.2 cm lesion upper pole right kidney is incompletely visualized but approaches water attenuation and is likely a cyst. Musculoskeletal: Bone windows reveal no worrisome  lytic or sclerotic osseous lesions. Review of the MIP images confirms the above findings. IMPRESSION: 1. No CT evidence of acute pulmonary embolus. 2. 2.2 cm nodule in the superior segment left lower lobe was 1.5 cm on the study from 7 years ago. Given the relatively slow interval progression, this is likely benign etiology although it cannot be characterized on today's study. This nodule showed no hypermetabolism on PET-CT of 07/26/2009. 3. Stable appearance chronic interstitial changes. Electronically Signed   By: Misty Stanley M.D.   On: 11/06/2016 20:41     ____________________________________________   PROCEDURES  Procedure(s) performed: None  Procedures  Critical Care performed: No ____________________________________________   INITIAL IMPRESSION / ASSESSMENT AND PLAN / ED COURSE  Pertinent labs & imaging results that were available during my care of the patient were reviewed by me and considered in my medical decision making (see chart for details).  83 y.o. female with a history of HTN and DM presenting with pleuritic chest pain. Overall, the patient is well-appearing and has no evidence of ACS or MI today. Her EKG does not show ischemic changes, her troponin is negative, and her chest x-ray does not show any acute cardiopulmonary process; her cardio pulmonary examination is normal. PE is less likely given that her oxygen saturation is normal, her heart rate is normal, and she has no evidence of DVT; however, given the pleuritic nature of her chest pain and otherwise normal workup, we will get a CT angiogram to evaluate for PE. The patient has no signs or symptoms consistent with pneumonia. She does have a UTI, and an elevated serum wbc, and we'll repeat a urine here so can send it for culture; I have ordered a dose of Rocephin. Plan reevaluation for final disposition.  ____________________________________________  FINAL CLINICAL IMPRESSION(S) / ED DIAGNOSES  Final diagnoses:  Nodule of left lung  Chest pain, unspecified type  Acute cystitis without hematuria         NEW MEDICATIONS STARTED DURING THIS VISIT:  New Prescriptions   CEPHALEXIN (KEFLEX) 500 MG CAPSULE    Take 1 capsule (500 mg total) by mouth 4 (four) times daily.      Eula Listen, MD 11/06/16 2117

## 2016-11-07 ENCOUNTER — Encounter
Admission: RE | Admit: 2016-11-07 | Discharge: 2016-11-07 | Disposition: A | Discharge status: Home / Self Care | Payer: Medicare Other | Source: Ambulatory Visit | Attending: Internal Medicine | Admitting: Internal Medicine

## 2016-11-09 LAB — URINE CULTURE: Culture: >=100000 — AB

## 2016-11-10 ENCOUNTER — Other Ambulatory Visit
Admission: RE | Admit: 2016-11-10 | Discharge: 2016-11-10 | Disposition: A | Discharge status: Home / Self Care | Payer: Medicare Other | Source: Ambulatory Visit | Attending: Gerontology | Admitting: Gerontology

## 2016-11-10 DIAGNOSIS — D72829 Elevated white blood cell count, unspecified: Secondary | ICD-10-CM | POA: Diagnosis present

## 2016-11-10 DIAGNOSIS — E871 Hypo-osmolality and hyponatremia: Secondary | ICD-10-CM | POA: Diagnosis present

## 2016-11-10 DIAGNOSIS — Z87440 Personal history of urinary (tract) infections: Secondary | ICD-10-CM | POA: Insufficient documentation

## 2016-11-10 LAB — CBC WITH DIFFERENTIAL/PLATELET
BASOS PCT: 0 %
Basophils Absolute: 0 10*3/uL (ref 0–0.1)
Eosinophils Absolute: 0 10*3/uL (ref 0–0.7)
Eosinophils Relative: 0 %
HEMATOCRIT: 39.4 % (ref 35.0–47.0)
HEMOGLOBIN: 13.2 g/dL (ref 12.0–16.0)
LYMPHS ABS: 3.1 10*3/uL (ref 1.0–3.6)
LYMPHS PCT: 28 %
MCH: 30.1 pg (ref 26.0–34.0)
MCHC: 33.5 g/dL (ref 32.0–36.0)
MCV: 89.9 fL (ref 80.0–100.0)
MONOS PCT: 6 %
Monocytes Absolute: 0.7 10*3/uL (ref 0.2–0.9)
NEUTROS ABS: 7 10*3/uL — AB (ref 1.4–6.5)
NEUTROS PCT: 66 %
Platelets: 307 10*3/uL (ref 150–440)
RBC: 4.39 MIL/uL (ref 3.80–5.20)
RDW: 13.3 % (ref 11.5–14.5)
WBC: 10.8 10*3/uL (ref 3.6–11.0)

## 2016-11-10 LAB — COMPREHENSIVE METABOLIC PANEL
ALBUMIN: 4.3 g/dL (ref 3.5–5.0)
ALK PHOS: 60 U/L (ref 38–126)
ALT: 23 U/L (ref 14–54)
ANION GAP: 11 (ref 5–15)
AST: 19 U/L (ref 15–41)
BILIRUBIN TOTAL: 1 mg/dL (ref 0.3–1.2)
BUN: 25 mg/dL — AB (ref 6–20)
CALCIUM: 9.5 mg/dL (ref 8.9–10.3)
CO2: 18 mmol/L — ABNORMAL LOW (ref 22–32)
Chloride: 106 mmol/L (ref 101–111)
Creatinine, Ser: 0.67 mg/dL (ref 0.44–1.00)
GFR calc Af Amer: >60 mL/min (ref >60)
GFR calc non Af Amer: >60 mL/min (ref >60)
Glucose, Bld: 204 mg/dL — ABNORMAL HIGH (ref 65–99)
Potassium: 4.6 mmol/L (ref 3.5–5.1)
Sodium: 135 mmol/L (ref 135–145)
TOTAL PROTEIN: 7.3 g/dL (ref 6.5–8.1)

## 2016-11-18 ENCOUNTER — Non-Acute Institutional Stay (SKILLED_NURSING_FACILITY): Payer: Medicare Other | Admitting: Gerontology

## 2016-11-18 DIAGNOSIS — N390 Urinary tract infection, site not specified: Secondary | ICD-10-CM

## 2016-11-19 ENCOUNTER — Encounter: Payer: Self-pay | Admitting: Gerontology

## 2016-11-19 ENCOUNTER — Other Ambulatory Visit
Admission: RE | Admit: 2016-11-19 | Discharge: 2016-11-19 | Disposition: A | Discharge status: Home / Self Care | Payer: Medicare Other | Source: Ambulatory Visit | Attending: Internal Medicine | Admitting: Internal Medicine

## 2016-11-19 DIAGNOSIS — R410 Disorientation, unspecified: Secondary | ICD-10-CM | POA: Insufficient documentation

## 2016-11-19 LAB — URINALYSIS, COMPLETE (UACMP) WITH MICROSCOPIC
BILIRUBIN URINE: NEGATIVE
Bacteria, UA: NONE SEEN
Glucose, UA: >=500 mg/dL — AB
HGB URINE DIPSTICK: NEGATIVE
Ketones, ur: 5 mg/dL — AB
Leukocytes, UA: NEGATIVE
NITRITE: NEGATIVE
PH: 5 (ref 5.0–8.0)
Protein, ur: NEGATIVE mg/dL
RBC / HPF: NONE SEEN RBC/hpf (ref 0–5)
SPECIFIC GRAVITY, URINE: 1.029 (ref 1.005–1.030)
WBC, UA: NONE SEEN WBC/hpf (ref 0–5)

## 2016-11-19 NOTE — Progress Notes (Signed)
Location:   [facility]:  SNF [phone]) Provider:  Toni Arthurs, NP-C  [name], MD  Patient Care Team: [name], MD as PCP - General (Internal Medicine)  Extended Emergency Contact Information Primary Emergency Contact: Irwin,Cheryl E Address: [address]          [place], [place] [id number] Johnnette Litter of [place] Phone: [phone] Mobile Phone: [phone] Relation: Daughter Secondary Emergency Contact: Philippa Sicks [place] of [place] Phone: [phone] Mobile Phone: [phone] Relation: Daughter  Code Status:  Full  Goals of care: Advanced Directive information Advanced Directives 11/19/2016  Does Patient Have a Medical Advance Directive? No  Would patient like information on creating a medical advance directive? -     Chief Complaint  Patient presents with  . Medical management of chronic illnesses    Routine visit    HPI:  Pt is a 83 y.o. female seen today for Medical management of chronic illnesses. Pt was admitted to the facility for rehab/skilled nursing after diagnosis of UTI. Pt continues on antibiotics for the infection. No adverse effects. After antibiotics are complete and pt is stable, she will be moving to Assisted Living. Daughter has requested some PT/OT d/t weakness, deconditioning. Pt denies n/v/d/f/c/cp/sob/ha/abd pain/dizziness/cough/pain/CV tenderness. VSS. No other complaints.     Past Medical History:  Diagnosis Date  . Diabetes mellitus without complication ([place])   . Hypertension    Past Surgical History:  Procedure Laterality Date  . APPENDECTOMY      Allergies  Allergen Reactions  . Epinephrine Other (See Comments)    Other Reaction: Passed out after dental inject  . Latex Other (See Comments)    Other Reaction: angioedema, mouth burns  . Levofloxacin     Other reaction(s): Hallucination  . Other Other (See Comments)    Uncoded Allergy. Allergen: ENVIRONMENTAL ALLERGIES     Allergies as of 11/18/2016   No Known Allergies     Medication List       Accurate as of 11/18/16 11:59 PM. Always use your most recent med list.          atorvastatin 20 MG tablet Commonly known as:  LIPITOR Take 20 mg by mouth daily. 8 am   dexamethasone 0.5 MG/5ML solution Commonly known as:  DECADRON Take 0.5 mg by mouth 4 (four) times daily. Rinse for one full minute and spit out.  8 am, 1 pm, 5 pm, 9 pm   enalapril 20 MG tablet Commonly known as:  VASOTEC Take 20 mg by mouth 2 (two) times daily. 8 am and 5 pm   levothyroxine 50 MCG tablet Commonly known as:  SYNTHROID, LEVOTHROID Take 50 mcg by mouth daily before breakfast. 6 am   metFORMIN 500 MG tablet Commonly known as:  GLUCOPHAGE Take 500 mg by mouth 2 (two) times daily with a meal. 8 am and 5 pm   potassium chloride 10 MEQ tablet Commonly known as:  K-DUR,KLOR-CON Take 10 mEq by mouth daily. 8 am   rivastigmine 9.5 mg/24hr Commonly known as:  EXELON Place 9.5 mg onto the skin daily. 8 am   TYLENOL ARTHRITIS PAIN 650 MG CR tablet Generic drug:  acetaminophen Take 650 mg by mouth every 8 (eight) hours as needed for pain. 6 am , 2 pm and 10 pm       Review of Systems  Constitutional: Negative for activity change, appetite change, chills, diaphoresis and fever.  HENT: Negative for congestion, sneezing, sore throat, trouble  swallowing and voice change.   Respiratory: Negative for apnea, cough, choking, chest tightness, shortness of breath and wheezing.   Cardiovascular: Negative for chest pain, palpitations and leg swelling.  Gastrointestinal: Negative for abdominal distention, abdominal pain, constipation, diarrhea and nausea.  Genitourinary: Negative for difficulty urinating, dysuria, flank pain, frequency, hematuria and urgency.  Musculoskeletal: Positive for arthralgias (typical arthritis). Negative for back pain, gait problem and myalgias.  Skin: Negative for color change, pallor, rash and wound.   Neurological: Positive for weakness. Negative for dizziness, tremors, syncope, speech difficulty, numbness and headaches.  Psychiatric/Behavioral: Negative for agitation and behavioral problems.  All other systems reviewed and are negative.   Immunization History  Administered Date(s) Administered  . Influenza Inj Mdck Quad Pf 03/26/2016  . Influenza-Unspecified 02/16/2014  . Pneumococcal Polysaccharide-23 07/30/2015   There are no preventive care reminders to display for this patient. No flowsheet data found. Functional Status Survey:    Vitals:   11/18/16 1227  BP: 125/63  Pulse: 85  Resp: 18  Temp: 98.3 F (36.8 C)  SpO2: 98%  Weight: 143 lb 12.8 oz (65.2 kg)  Height: 4\' 11"  (1.499 m)   Body mass index is 29.04 kg/m. Physical Exam  Constitutional: She is oriented to person, place, and time. Vital signs are normal. She appears well-developed and well-nourished. She is active and cooperative. She does not appear ill. No distress.  HENT:  Head: Normocephalic and atraumatic.  Mouth/Throat: Uvula is midline, oropharynx is clear and moist and mucous membranes are normal. Mucous membranes are not pale, not dry and not cyanotic.  Eyes: Conjunctivae, EOM and lids are normal. Pupils are equal, round, and reactive to light.  Neck: Trachea normal, normal range of motion and full passive range of motion without pain. Neck supple. No JVD present. No tracheal deviation, no edema and no erythema present. No thyromegaly present.  Cardiovascular: Normal rate, regular rhythm, normal heart sounds, intact distal pulses and normal pulses.  Exam reveals no gallop, no distant heart sounds and no friction rub.   No murmur heard. Pulses:      Dorsalis pedis pulses are 2+ on the right side, and 2+ on the left side.  Pulmonary/Chest: Effort normal and breath sounds normal. No accessory muscle usage. No respiratory distress. She has no wheezes. She has no rales. She exhibits no tenderness.   Abdominal: Normal appearance and bowel sounds are normal. She exhibits no distension and no ascites. There is no tenderness.  Musculoskeletal: Normal range of motion. She exhibits no edema or tenderness.  Expected osteoarthritis, stiffness  Neurological: She is alert and oriented to person, place, and time. She has normal strength.  Generalized weakness  Skin: Skin is warm, dry and intact. No rash noted. She is not diaphoretic. No cyanosis or erythema. No pallor. Nails show no clubbing.  Psychiatric: She has a normal mood and affect. Her speech is normal and behavior is normal. Judgment and thought content normal. Cognition and memory are normal.  Nursing note and vitals reviewed.   Labs reviewed:  Recent Labs  11/06/16 1748 11/10/16 0530  NA 132* 135  K 4.0 4.6  CL 103 106  CO2 19* 18*  GLUCOSE 159* 204*  BUN 34* 25*  CREATININE 0.70 0.67  CALCIUM 10.3 9.5    Recent Labs  11/10/16 0530  AST 19  ALT 23  ALKPHOS 60  BILITOT 1.0  PROT 7.3  ALBUMIN 4.3    Recent Labs  11/06/16 1748 11/10/16 0530  WBC 15.6* 10.8  NEUTROABS  --  7.0*  HGB 12.9 13.2  HCT 37.8 39.4  MCV 88.1 89.9  PLT 317 307   No results found for: TSH No results found for: HGBA1C No results found for: CHOL, HDL, LDLCALC, LDLDIRECT, TRIG, CHOLHDL  Significant Diagnostic Results in last 30 days:  Dg Chest 2 View  Result Date: 11/06/2016 CLINICAL DATA:  Chest pain radiating into the back for the past 2 days intermittently. EXAM: CHEST  2 VIEW COMPARISON:  06/30/2015. FINDINGS: Normal sized heart. Clear lungs. Stable prominence of the interstitial markings with no pleural fluid. Unremarkable bones. IMPRESSION: No acute abnormality.  Stable chronic interstitial lung disease. Electronically Signed   By: Claudie Revering M.D.   On: 11/06/2016 18:20   Ct Angio Chest Pe W Or Wo Contrast  Result Date: 11/06/2016 CLINICAL DATA:  Chest pain for 2 days. EXAM: CT ANGIOGRAPHY CHEST WITH CONTRAST TECHNIQUE:  Multidetector CT imaging of the chest was performed using the standard protocol during bolus administration of intravenous contrast. Multiplanar CT image reconstructions and MIPs were obtained to evaluate the vascular anatomy. CONTRAST:  75 cc Isovue 370 COMPARISON:  11/29/2009 FINDINGS: Cardiovascular: Heart size upper normal. No pericardial effusion. Coronary artery calcification is noted. Atherosclerotic calcification is noted in the wall of the thoracic aorta. No filling defect within the opacified pulmonary arteries to indicate the presence of an acute pulmonary embolus. Mediastinum/Nodes: Small lymph nodes are seen scattered in the mediastinum without lymphadenopathy. Calcified lymph nodes are evident in the right hilum. There is no hilar lymphadenopathy. The esophagus has normal imaging features. Tiny hiatal hernia noted. There is no axillary lymphadenopathy. Lungs/Pleura: Chronic interstitial changes are noted in the lung bases similar to prior. Associated peripheral lower lobe bronchiectasis is similar. 2.2 cm nodule in the superior segment left lower lobe measured 1.5 cm on the prior study. No pulmonary edema or pleural effusion. No focal airspace consolidation to suggest pneumonia. Upper Abdomen: 3.2 cm lesion upper pole right kidney is incompletely visualized but approaches water attenuation and is likely a cyst. Musculoskeletal: Bone windows reveal no worrisome lytic or sclerotic osseous lesions. Review of the MIP images confirms the above findings. IMPRESSION: 1. No CT evidence of acute pulmonary embolus. 2. 2.2 cm nodule in the superior segment left lower lobe was 1.5 cm on the study from 7 years ago. Given the relatively slow interval progression, this is likely benign etiology although it cannot be characterized on today's study. This nodule showed no hypermetabolism on PET-CT of 07/26/2009. 3. Stable appearance chronic interstitial changes. Electronically Signed   By: Misty Stanley M.D.   On:  11/06/2016 20:41    Assessment/Plan 1. Urinary tract infection without hematuria, site unspecified  Continue Keflex 500 mg po QID until course complete  Safety precautions  Fall precautions  PT/OT when appropriate  Transfer to AL when stable  Family/ staff Communication:   Total Time:  Documentation:  Face to Face:  Family/Phone:   Labs/tests ordered:    Medication list reviewed and assessed for continued appropriateness. Monthly medication orders reviewed and signed.  Vikki Ports, NP-C Geriatrics [facility] [phone], [place] [address] Cell Phone (Mon-Fri 8am-5pm):  [phone] On Call:  [phone] & follow prompts after 5pm & weekends Office Phone:  [phone] Office Fax:  [phone]

## 2016-11-20 LAB — URINE CULTURE: Culture: <10000 — AB

## 2016-12-24 ENCOUNTER — Encounter
Admission: RE | Admit: 2016-12-24 | Discharge: 2016-12-24 | Disposition: A | Discharge status: Home / Self Care | Payer: Medicare Other | Source: Ambulatory Visit | Attending: Internal Medicine | Admitting: Internal Medicine

## 2016-12-29 ENCOUNTER — Non-Acute Institutional Stay: Payer: Medicare Other | Admitting: Gerontology

## 2016-12-29 ENCOUNTER — Encounter: Payer: Self-pay | Admitting: Gerontology

## 2016-12-29 DIAGNOSIS — F419 Anxiety disorder, unspecified: Secondary | ICD-10-CM | POA: Diagnosis not present

## 2016-12-29 DIAGNOSIS — G301 Alzheimer's disease with late onset: Secondary | ICD-10-CM | POA: Diagnosis not present

## 2016-12-29 DIAGNOSIS — E785 Hyperlipidemia, unspecified: Secondary | ICD-10-CM

## 2016-12-29 DIAGNOSIS — M1990 Unspecified osteoarthritis, unspecified site: Secondary | ICD-10-CM | POA: Diagnosis not present

## 2016-12-29 DIAGNOSIS — I10 Essential (primary) hypertension: Secondary | ICD-10-CM | POA: Diagnosis not present

## 2016-12-29 DIAGNOSIS — F0280 Dementia in other diseases classified elsewhere without behavioral disturbance: Secondary | ICD-10-CM | POA: Diagnosis not present

## 2016-12-29 DIAGNOSIS — E876 Hypokalemia: Secondary | ICD-10-CM | POA: Diagnosis not present

## 2016-12-29 DIAGNOSIS — N764 Abscess of vulva: Secondary | ICD-10-CM | POA: Diagnosis not present

## 2016-12-29 DIAGNOSIS — H8112 Benign paroxysmal vertigo, left ear: Secondary | ICD-10-CM

## 2016-12-29 DIAGNOSIS — E039 Hypothyroidism, unspecified: Secondary | ICD-10-CM

## 2016-12-29 DIAGNOSIS — E119 Type 2 diabetes mellitus without complications: Secondary | ICD-10-CM | POA: Diagnosis not present

## 2016-12-29 NOTE — Progress Notes (Addendum)
Location:   [facility] Room Number: [phone] Place of Service:  ALF [phone]) Provider:  Toni Arthurs, NP-C  [name], MD  Patient Care Team: [name], MD as PCP - General (Internal Medicine)  Extended Emergency Contact Information Primary Emergency Contact: Irwin,Cheryl E Address: [address]          [place], [place] [id number] Johnnette Litter of [place] Phone: [phone] Mobile Phone: [phone] Relation: Daughter Secondary Emergency Contact: Philippa Sicks [place] of [place] Phone: [phone] Mobile Phone: [phone] Relation: Daughter  Code Status:  Full  Goals of care: Advanced Directive information Advanced Directives 12/29/2016  Does Patient Have a Medical Advance Directive? No  Would patient like information on creating a medical advance directive? -     Chief Complaint  Patient presents with  . Medical management of chronic illnesses    Routine visit    HPI:  Pt is a 83 y.o. female seen today for Medical management of chronic illnesses. Pt was admitted to the facility for rehab/skilled nursing after diagnosis of UTI. Pt completed coarse of antibiotics for the infection. Pt was doing well with skilled care (better than alone in the apartment), family decided for her to stay on AL longterm.  No adverse effects from antibiotics.  Daughter has requested some PT/OT d/t weakness, deconditioning. Pt denies n/v/d/f/c/cp/sob/ha/abd pain/dizziness/cough/pain. No falls. Also, staff concerned about pt having draining wounds in the suprapubic region. Pt reports she has had those for years. They come and go. Appear as a pimple with a white head, that rupture and drain white/clear fluid. When pt removed her pants to show me the wounds, we found she had "bloody discharge" that appeared vaginal in her pants. Upon closer inspection, pt has a "grape-sized" swelling on the distal aspect of the left labia minor- next to the vaginal opening. The  area of swelling is warm, red, painful to touch and is draining moderate amount of serosanginous, malodorous drainage. The area of swelling was expressed, with more fluid coming out. Pt reported that was very tender, but felt better after the fluid was expressed. Pt did not have actual vaginal bleeding, the fluid was coming from the labial abscess. Pt was very relieved to know she did not have vaginal bleeding. Pt is agreeable to course of antibiotics and hot compresses. The VSS. No other complaints.     Past Medical History:  Diagnosis Date  . Diabetes mellitus ([place])    diet controlled  . Diabetes mellitus without complication ([place])   . Dry eye syndrome   . Hyperlipidemia, unspecified   . Hypertension   . Inflammatory arthritis   . Osteoporosis   . Postmenopausal    Past Surgical History:  Procedure Laterality Date  . ABDOMINAL HYSTERECTOMY    . APPENDECTOMY    . CAPSULOTOMY     OS  . CATARACT EXTRACTION W/PHACO Right 08/2008   crystalens 24.0D  . CATARACT EXTRACTION W/PHACO Left 01/18/2009   Crystalens 24.5D    Allergies  Allergen Reactions  . Epinephrine Other (See Comments)    Other Reaction: Passed out after dental inject  . Latex Other (See Comments)    Other Reaction: angioedema, mouth burns  . Levofloxacin     Other reaction(s): Hallucination  . Other Other (See Comments)    Uncoded Allergy. Allergen: ENVIRONMENTAL ALLERGIES    Allergies as of 12/29/2016      Reactions   Epinephrine Other (See Comments)   Other Reaction: Passed out after dental inject  Latex Other (See Comments)   Other Reaction: angioedema, mouth burns   Levofloxacin    Other reaction(s): Hallucination   Other Other (See Comments)   Uncoded Allergy. Allergen: ENVIRONMENTAL ALLERGIES      Medication List       Accurate as of 12/29/16 10:24 AM. Always use your most recent med list.          atorvastatin 10 MG tablet Commonly known as:  LIPITOR Take 10 mg by mouth daily.     dexamethasone 0.5 MG/5ML solution Commonly known as:  DECADRON Take 0.5 mg by mouth 4 (four) times daily. Rinse for one full minute and spit out.  8 am, 1 pm, 5 pm, 9 pm   enalapril 20 MG tablet Commonly known as:  VASOTEC Take 20 mg by mouth 2 (two) times daily. 8 am and 5 pm   levothyroxine 50 MCG tablet Commonly known as:  SYNTHROID, LEVOTHROID Take 50 mcg by mouth daily before breakfast. 6 am   metFORMIN 500 MG tablet Commonly known as:  GLUCOPHAGE Take 500 mg by mouth 2 (two) times daily with a meal. 8 am and 5 pm   potassium chloride 10 MEQ tablet Commonly known as:  K-DUR,KLOR-CON Take 20 mEq by mouth daily. 2 tabs   rivastigmine 9.5 mg/24hr Commonly known as:  EXELON Place 9.5 mg onto the skin daily. 8 am   TYLENOL ARTHRITIS PAIN 650 MG CR tablet Generic drug:  acetaminophen Take 650 mg by mouth 2 (two) times daily.       Review of Systems  Constitutional: Negative for activity change, appetite change, chills, diaphoresis and fever.  HENT: Negative for congestion, sneezing, sore throat, trouble swallowing and voice change.   Respiratory: Negative for apnea, cough, choking, chest tightness, shortness of breath and wheezing.   Cardiovascular: Negative for chest pain, palpitations and leg swelling.  Gastrointestinal: Negative for abdominal distention, abdominal pain, constipation, diarrhea and nausea.  Genitourinary: Positive for vaginal pain. Negative for difficulty urinating, dysuria, flank pain, frequency, hematuria and urgency.  Musculoskeletal: Positive for arthralgias (typical arthritis). Negative for back pain, gait problem and myalgias.  Skin: Positive for wound. Negative for color change, pallor and rash.  Neurological: Positive for weakness. Negative for dizziness, tremors, syncope, speech difficulty, numbness and headaches.  Psychiatric/Behavioral: Negative for agitation and behavioral problems.  All other systems reviewed and are  negative.   Immunization History  Administered Date(s) Administered  . Influenza Inj Mdck Quad Pf 03/26/2016  . Influenza-Unspecified 02/16/2014  . Pneumococcal Polysaccharide-23 07/30/2015   Pertinent  Health Maintenance Due  Topic Date Due  . DEXA SCAN  12/29/1996  . PNA vac Low Risk Adult (2 of 2 - PCV13) 07/29/2016  . INFLUENZA VACCINE  12/24/2016   No flowsheet data found. Functional Status Survey:    Vitals:   12/29/16 0942  BP: (!) 127/57  Pulse: 87  Resp: 18  Temp: (!) 96.7 F (35.9 C)  SpO2: 98%  Weight: 137 lb 3.2 oz (62.2 kg)  Height: 4\' 11"  (1.499 m)   Body mass index is 27.71 kg/m. Physical Exam  Constitutional: She is oriented to person, place, and time. Vital signs are normal. She appears well-developed and well-nourished. She is active and cooperative. She does not appear ill. No distress.  HENT:  Head: Normocephalic and atraumatic.  Mouth/Throat: Uvula is midline, oropharynx is clear and moist and mucous membranes are normal. Mucous membranes are not pale, not dry and not cyanotic.  Eyes: Pupils are equal, round, and reactive to  light. Conjunctivae, EOM and lids are normal.  Neck: Trachea normal, normal range of motion and full passive range of motion without pain. Neck supple. No JVD present. No tracheal deviation, no edema and no erythema present. No thyromegaly present.  Cardiovascular: Normal rate, regular rhythm, normal heart sounds, intact distal pulses and normal pulses.  Exam reveals no gallop, no distant heart sounds and no friction rub.   No murmur heard. Pulses:      Dorsalis pedis pulses are 2+ on the right side, and 2+ on the left side.  Pulmonary/Chest: Effort normal and breath sounds normal. No accessory muscle usage. No respiratory distress. She has no wheezes. She has no rales. She exhibits no tenderness.  Abdominal: Normal appearance and bowel sounds are normal. She exhibits no distension and no ascites. There is no tenderness.   Genitourinary:    There is tenderness and lesion on the left labia.  Musculoskeletal: Normal range of motion. She exhibits no edema or tenderness.  Expected osteoarthritis, stiffness  Neurological: She is alert and oriented to person, place, and time. She has normal strength.  Generalized weakness  Skin: Skin is warm, dry and intact. No rash noted. She is not diaphoretic. No cyanosis or erythema. No pallor. Nails show no clubbing.  Psychiatric: She has a normal mood and affect. Her speech is normal and behavior is normal. Judgment and thought content normal. Cognition and memory are normal.  Nursing note and vitals reviewed.   Labs reviewed:  Recent Labs  11/06/16 1748 11/10/16 0530  NA 132* 135  K 4.0 4.6  CL 103 106  CO2 19* 18*  GLUCOSE 159* 204*  BUN 34* 25*  CREATININE 0.70 0.67  CALCIUM 10.3 9.5    Recent Labs  11/10/16 0530  AST 19  ALT 23  ALKPHOS 60  BILITOT 1.0  PROT 7.3  ALBUMIN 4.3    Recent Labs  11/06/16 1748 11/10/16 0530  WBC 15.6* 10.8  NEUTROABS  --  7.0*  HGB 12.9 13.2  HCT 37.8 39.4  MCV 88.1 89.9  PLT 317 307   No results found for: TSH No results found for: HGBA1C No results found for: CHOL, HDL, LDLCALC, LDLDIRECT, TRIG, CHOLHDL  Significant Diagnostic Results in last 30 days:  No results found.  Assessment/Plan 1.  Alzheimer's disease with late onset (CODE)  Stable   Continue Exelon patch 9.5 mg TD Q Day  2. Dementia in other diseases classified elsewhere without behavioral disturbance  Stable   3. Benign paroxysmal vertigo of left ear  Stable  4. Diabetes mellitus without complication (HCC)  Stable  Continue Metformin 500 mg po BID  5. Essential (primary) hypertension  Stable   Continue Enalapril 20 mg po BID  6. Hyperlipidemia, unspecified hyperlipidemia type  Stable   Continue Atorvastatin 10 mg po Q Day  7. Hypothyroidism, unspecified type  Stable  Continue Synthroid 50 mcg po Q Day  8.  Hypokalemia  Stable   Continue Klor-Con 10 meq- 2 tablets po Q Day  9. Anxiety disorder, unspecified type  Stable   10. Osteoarthritis, unspecified osteoarthritis type, unspecified site  Stable  Continue Tylenol SR 650 mg - 2 tablets po BID   11. Labial abscess  Keflex 500 mg po Q 6 hours x 7 days  Hot, moist compresses BID to the perineal area until healed  Bactroban 2% ointment to suprapubic lesions Q Day  Family/ staff Communication:   Total Time:  Documentation:  Face to Face:  Family/Phone:   Labs/tests  ordered:    Medication list reviewed and assessed for continued appropriateness. Monthly medication orders reviewed and signed.  Vikki Ports, NP-C Geriatrics [facility] [phone], [place] [address] Cell Phone (Mon-Fri 8am-5pm):  [phone] On Call:  [phone] & follow prompts after 5pm & weekends Office Phone:  [phone] Office Fax:  [phone]

## 2017-01-06 DIAGNOSIS — Z0000 Encounter for general adult medical examination without abnormal findings: Secondary | ICD-10-CM | POA: Insufficient documentation

## 2017-01-09 ENCOUNTER — Ambulatory Visit
Admission: RE | Admit: 2017-01-09 | Discharge: 2017-01-09 | Disposition: A | Discharge status: Home / Self Care | Payer: Medicare Other | Source: Ambulatory Visit | Attending: Internal Medicine | Admitting: Internal Medicine

## 2017-01-09 DIAGNOSIS — Z1231 Encounter for screening mammogram for malignant neoplasm of breast: Secondary | ICD-10-CM

## 2017-01-14 DIAGNOSIS — E039 Hypothyroidism, unspecified: Secondary | ICD-10-CM | POA: Insufficient documentation

## 2017-01-14 DIAGNOSIS — F419 Anxiety disorder, unspecified: Secondary | ICD-10-CM | POA: Insufficient documentation

## 2017-01-14 DIAGNOSIS — E119 Type 2 diabetes mellitus without complications: Secondary | ICD-10-CM | POA: Insufficient documentation

## 2017-01-14 DIAGNOSIS — G301 Alzheimer's disease with late onset: Secondary | ICD-10-CM | POA: Insufficient documentation

## 2017-01-14 DIAGNOSIS — M1990 Unspecified osteoarthritis, unspecified site: Secondary | ICD-10-CM | POA: Insufficient documentation

## 2017-01-14 DIAGNOSIS — E785 Hyperlipidemia, unspecified: Secondary | ICD-10-CM | POA: Insufficient documentation

## 2017-01-14 DIAGNOSIS — E876 Hypokalemia: Secondary | ICD-10-CM | POA: Insufficient documentation

## 2017-01-14 DIAGNOSIS — F0280 Dementia in other diseases classified elsewhere without behavioral disturbance: Secondary | ICD-10-CM | POA: Insufficient documentation

## 2017-01-14 DIAGNOSIS — I10 Essential (primary) hypertension: Secondary | ICD-10-CM | POA: Insufficient documentation

## 2017-01-14 DIAGNOSIS — H8112 Benign paroxysmal vertigo, left ear: Secondary | ICD-10-CM | POA: Insufficient documentation

## 2017-01-24 ENCOUNTER — Encounter
Admission: RE | Admit: 2017-01-24 | Discharge: 2017-01-24 | Disposition: A | Discharge status: Home / Self Care | Payer: Medicare Other | Source: Ambulatory Visit | Attending: Internal Medicine | Admitting: Internal Medicine

## 2017-01-29 ENCOUNTER — Encounter: Payer: Self-pay | Admitting: Gerontology

## 2017-01-29 ENCOUNTER — Non-Acute Institutional Stay: Payer: Medicare Other | Admitting: Gerontology

## 2017-01-29 DIAGNOSIS — E039 Hypothyroidism, unspecified: Secondary | ICD-10-CM | POA: Diagnosis not present

## 2017-01-29 DIAGNOSIS — H8112 Benign paroxysmal vertigo, left ear: Secondary | ICD-10-CM

## 2017-01-29 DIAGNOSIS — F419 Anxiety disorder, unspecified: Secondary | ICD-10-CM

## 2017-01-29 DIAGNOSIS — G301 Alzheimer's disease with late onset: Secondary | ICD-10-CM | POA: Diagnosis not present

## 2017-01-29 DIAGNOSIS — E876 Hypokalemia: Secondary | ICD-10-CM | POA: Diagnosis not present

## 2017-01-29 DIAGNOSIS — E119 Type 2 diabetes mellitus without complications: Secondary | ICD-10-CM

## 2017-01-29 DIAGNOSIS — E785 Hyperlipidemia, unspecified: Secondary | ICD-10-CM

## 2017-01-29 DIAGNOSIS — M1990 Unspecified osteoarthritis, unspecified site: Secondary | ICD-10-CM | POA: Diagnosis not present

## 2017-01-29 DIAGNOSIS — N764 Abscess of vulva: Secondary | ICD-10-CM

## 2017-01-29 DIAGNOSIS — F0280 Dementia in other diseases classified elsewhere without behavioral disturbance: Secondary | ICD-10-CM

## 2017-01-29 DIAGNOSIS — I10 Essential (primary) hypertension: Secondary | ICD-10-CM | POA: Diagnosis not present

## 2017-01-29 NOTE — Progress Notes (Signed)
Location:   [facility] Room Number: [place] of Service:  ALF [phone]) Provider:  Toni Arthurs, NP-C  [name], MD  Patient Care Team: [name], MD as PCP - General (Internal Medicine)  Extended Emergency Contact Information Primary Emergency Contact: Irwin,Cheryl E Address: [address]          [place], [place] [id number] Johnnette Litter of [place] Phone: [phone] Mobile Phone: [phone] Relation: Daughter Secondary Emergency Contact: Philippa Sicks [place] of [place] Phone: [phone] Mobile Phone: [phone] Relation: Daughter  Code Status:  Full  Goals of care: Advanced Directive information Advanced Directives 01/29/2017  Does Patient Have a Medical Advance Directive? No  Would patient like information on creating a medical advance directive? -     Chief Complaint  Patient presents with  . Medical management of chronic illnesses    Routine visit    HPI:  Pt is a 83 y.o. female seen today for Medical management of chronic illnesses.  Pt denies n/v/d/f/c/cp/sob/ha/abd pain/dizziness/cough/pain. No falls. Also, last month staff was concerned about pt having draining wounds in the suprapubic region. Pt reports she has had those for years. They come and go. Appear as a pimple with a white head, that rupture and drain white/clear fluid. When pt removed her pants to show me the wounds, we found she had "bloody discharge" that appeared vaginal in her pants. Upon closer inspection, pt has a "grape-sized" swelling on the distal aspect of the left labia minor- next to the vaginal opening. The area of swelling is warm, red, painful to touch and is draining moderate amount of serosanginous, malodorous drainage. The area of swelling was expressed, with more fluid coming out. Pt reported that was very tender, but felt better after the fluid was expressed. Pt did not have actual vaginal bleeding, the fluid was coming from the labial  abscess. Pt was very relieved to know she did not have vaginal bleeding. Pt received course of antibiotics and hot compresses. Staff reports the wounds are gone and pt no longer has the swelling or drainage. Pt denies pain in the area. The pt is friendly and sociable. Enjoys spending time with others. Is always well dressed. Pt reports appetite is good and has regular BMs. VSS. No other complaints.     Past Medical History:  Diagnosis Date  . Diabetes mellitus ([place])    diet controlled  . Diabetes mellitus without complication ([place])   . Dry eye syndrome   . Hyperlipidemia, unspecified   . Hypertension   . Inflammatory arthritis   . Osteoporosis   . Postmenopausal    Past Surgical History:  Procedure Laterality Date  . ABDOMINAL HYSTERECTOMY    . APPENDECTOMY    . CAPSULOTOMY     OS  . CATARACT EXTRACTION W/PHACO Right 08/2008   crystalens 24.0D  . CATARACT EXTRACTION W/PHACO Left 01/18/2009   Crystalens 24.5D    Allergies  Allergen Reactions  . Epinephrine Other (See Comments)    Other Reaction: Passed out after dental inject  . Latex Other (See Comments)    Other Reaction: angioedema, mouth burns  . Levofloxacin     Other reaction(s): Hallucination  . Other Other (See Comments)    Uncoded Allergy. Allergen: ENVIRONMENTAL ALLERGIES    Allergies as of 01/29/2017      Reactions   Epinephrine Other (See Comments)   Other Reaction: Passed out after dental inject   Latex Other (See Comments)   Other Reaction: angioedema, mouth  burns   Levofloxacin    Other reaction(s): Hallucination   Other Other (See Comments)   Uncoded Allergy. Allergen: ENVIRONMENTAL ALLERGIES      Medication List       Accurate as of 01/29/17  9:48 AM. Always use your most recent med list.          atorvastatin 10 MG tablet Commonly known as:  LIPITOR Take 10 mg by mouth daily.   enalapril 20 MG tablet Commonly known as:  VASOTEC Take 20 mg by mouth 2 (two) times daily. 8 am and 5 pm     levothyroxine 50 MCG tablet Commonly known as:  SYNTHROID, LEVOTHROID Take 50 mcg by mouth daily before breakfast. 6 am   metFORMIN 500 MG tablet Commonly known as:  GLUCOPHAGE Take 500 mg by mouth 2 (two) times daily with a meal. 8 am and 5 pm   mupirocin cream 2 % Commonly known as:  BACTROBAN Apply 1 application topically daily. Apply topically to pubic lesions   potassium chloride 10 MEQ tablet Commonly known as:  K-DUR,KLOR-CON Take 20 mEq by mouth daily. 2 tabs   rivastigmine 9.5 mg/24hr Commonly known as:  EXELON Place 9.5 mg onto the skin daily. 8 am   TYLENOL ARTHRITIS PAIN 650 MG CR tablet Generic drug:  acetaminophen Take 650 mg by mouth 2 (two) times daily.            Discharge Care Instructions        Start     Ordered   01/29/17 0000  Hemoglobin A1c    Comments:  This external order was created through the Results Console.    01/29/17 0948   01/29/17 0000  TSH    Comments:  This external order was created through the Results Console.    01/29/17 0948      Review of Systems  Constitutional: Negative for activity change, appetite change, chills, diaphoresis and fever.  HENT: Negative for congestion, sneezing, sore throat, trouble swallowing and voice change.   Respiratory: Negative for apnea, cough, choking, chest tightness, shortness of breath and wheezing.   Cardiovascular: Negative for chest pain, palpitations and leg swelling.  Gastrointestinal: Negative for abdominal distention, abdominal pain, constipation, diarrhea and nausea.  Genitourinary: Negative for difficulty urinating, dysuria, flank pain, frequency, hematuria, urgency and vaginal pain.  Musculoskeletal: Positive for arthralgias (typical arthritis). Negative for back pain, gait problem and myalgias.  Skin: Negative for color change, pallor, rash and wound.  Neurological: Negative for dizziness, tremors, syncope, speech difficulty, weakness, numbness and headaches.   Psychiatric/Behavioral: Negative for agitation and behavioral problems.  All other systems reviewed and are negative.   Immunization History  Administered Date(s) Administered  . Influenza Inj Mdck Quad Pf 03/26/2016  . Influenza-Unspecified 02/16/2014  . Pneumococcal Polysaccharide-23 07/30/2015   Pertinent  Health Maintenance Due  Topic Date Due  . FOOT EXAM  12/29/1939  . OPHTHALMOLOGY EXAM  12/29/1939  . DEXA SCAN  12/29/1996  . PNA vac Low Risk Adult (2 of 2 - PCV13) 07/29/2016  . INFLUENZA VACCINE  02/23/2017 (Originally 12/24/2016)  . HEMOGLOBIN A1C  04/29/2017   No flowsheet data found. Functional Status Survey:    Vitals:   01/29/17 0936  BP: (!) 127/57  Pulse: 87  Resp: 18  Temp: (!) 96.7 F (35.9 C)  SpO2: 98%  Weight: 141 lb 1.6 oz (64 kg)  Height: 4\' 11"  (1.499 m)   Body mass index is 28.5 kg/m. Physical Exam  Constitutional: She is oriented to  person, place, and time. Vital signs are normal. She appears well-developed and well-nourished. She is active and cooperative. She does not appear ill. No distress.  HENT:  Head: Normocephalic and atraumatic.  Mouth/Throat: Uvula is midline, oropharynx is clear and moist and mucous membranes are normal. Mucous membranes are not pale, not dry and not cyanotic.  Eyes: Pupils are equal, round, and reactive to light. Conjunctivae, EOM and lids are normal.  Neck: Trachea normal, normal range of motion and full passive range of motion without pain. Neck supple. No JVD present. No tracheal deviation, no edema and no erythema present. No thyromegaly present.  Cardiovascular: Normal rate, regular rhythm, normal heart sounds, intact distal pulses and normal pulses.  Exam reveals no gallop, no distant heart sounds and no friction rub.   No murmur heard. Pulses:      Dorsalis pedis pulses are 2+ on the right side, and 2+ on the left side.  Pulmonary/Chest: Effort normal and breath sounds normal. No accessory muscle usage. No  respiratory distress. She has no wheezes. She has no rales. She exhibits no tenderness.  Abdominal: Normal appearance and bowel sounds are normal. She exhibits no distension and no ascites. There is no tenderness.  Musculoskeletal: Normal range of motion. She exhibits no edema or tenderness.  Expected osteoarthritis, stiffness  Neurological: She is alert and oriented to person, place, and time. She has normal strength.  Generalized weakness  Skin: Skin is warm, dry and intact. No rash noted. She is not diaphoretic. No cyanosis or erythema. No pallor. Nails show no clubbing.  Psychiatric: She has a normal mood and affect. Her speech is normal and behavior is normal. Judgment and thought content normal. Cognition and memory are normal.  Nursing note and vitals reviewed.   Labs reviewed:  Recent Labs  11/06/16 1748 11/10/16 0530  NA 132* 135  K 4.0 4.6  CL 103 106  CO2 19* 18*  GLUCOSE 159* 204*  BUN 34* 25*  CREATININE 0.70 0.67  CALCIUM 10.3 9.5    Recent Labs  11/10/16 0530  AST 19  ALT 23  ALKPHOS 60  BILITOT 1.0  PROT 7.3  ALBUMIN 4.3    Recent Labs  11/06/16 1748 11/10/16 0530  WBC 15.6* 10.8  NEUTROABS  --  7.0*  HGB 12.9 13.2  HCT 37.8 39.4  MCV 88.1 89.9  PLT 317 307   Lab Results  Component Value Date   TSH 1.55 10/28/2016   Lab Results  Component Value Date   HGBA1C 6.9 10/28/2016   No results found for: CHOL, HDL, LDLCALC, LDLDIRECT, TRIG, CHOLHDL  Significant Diagnostic Results in last 30 days:  Mm Digital Screening Bilateral  Result Date: 01/09/2017 CLINICAL DATA:  Screening. EXAM: DIGITAL SCREENING BILATERAL MAMMOGRAM WITH CAD COMPARISON:  Previous exam(s). ACR Breast Density Category c: The breast tissue is heterogeneously dense, which may obscure small masses. FINDINGS: There are no findings suspicious for malignancy. Images were processed with CAD. IMPRESSION: No mammographic evidence of malignancy. A result letter of this screening  mammogram will be mailed directly to the patient. RECOMMENDATION: Screening mammogram in one year. (Code:SM-B-01Y) BI-RADS CATEGORY  1: Negative. Electronically Signed   By: Lajean Manes M.D.   On: 01/09/2017 11:44    Assessment/Plan 1.  Alzheimer's disease with late onset (CODE)  Stable   Continue Exelon patch 9.5 mg TD Q Day  2. Dementia in other diseases classified elsewhere without behavioral disturbance  Stable   3. Benign paroxysmal vertigo of left ear  Stable  4. Diabetes mellitus without complication (HCC)  Stable  Continue Metformin 500 mg po BID  5. Essential (primary) hypertension  Stable   Continue Enalapril 20 mg po BID  6. Hyperlipidemia, unspecified hyperlipidemia type  Stable   Continue Atorvastatin 10 mg po Q Day  7. Hypothyroidism, unspecified type  Stable  Continue Synthroid 50 mcg po Q Day  8. Hypokalemia  Stable   Continue Klor-Con 10 meq- 2 tablets po Q Day  9. Anxiety disorder, unspecified type  Stable   10. Osteoarthritis, unspecified osteoarthritis type, unspecified site  Stable  Continue Tylenol SR 650 mg - 2 tablets po BID   11. Labial abscess  Resolved  Changed Bactroban 2% cream to Q Day prn  Family/ staff Communication:   Total Time:  Documentation:  Face to Face:  Family/Phone:   Labs/tests ordered:    Medication list reviewed and assessed for continued appropriateness. Monthly medication orders reviewed and signed.  Vikki Ports, NP-C Geriatrics [facility] [phone], [place] [address] Cell Phone (Mon-Fri 8am-5pm):  [phone] On Call:  [phone] & follow prompts after 5pm & weekends Office Phone:  [phone] Office Fax:  [phone]

## 2017-02-23 ENCOUNTER — Encounter
Admission: RE | Admit: 2017-02-23 | Discharge: 2017-02-23 | Disposition: A | Discharge status: Home / Self Care | Payer: Medicare Other | Source: Ambulatory Visit | Attending: Internal Medicine | Admitting: Internal Medicine

## 2017-03-02 ENCOUNTER — Non-Acute Institutional Stay: Payer: Medicare Other | Admitting: Gerontology

## 2017-03-02 ENCOUNTER — Encounter: Payer: Self-pay | Admitting: Gerontology

## 2017-03-02 DIAGNOSIS — E876 Hypokalemia: Secondary | ICD-10-CM

## 2017-03-02 DIAGNOSIS — G301 Alzheimer's disease with late onset: Secondary | ICD-10-CM | POA: Diagnosis not present

## 2017-03-02 DIAGNOSIS — M1990 Unspecified osteoarthritis, unspecified site: Secondary | ICD-10-CM | POA: Diagnosis not present

## 2017-03-02 DIAGNOSIS — I10 Essential (primary) hypertension: Secondary | ICD-10-CM

## 2017-03-02 DIAGNOSIS — F0280 Dementia in other diseases classified elsewhere without behavioral disturbance: Secondary | ICD-10-CM | POA: Diagnosis not present

## 2017-03-02 DIAGNOSIS — E785 Hyperlipidemia, unspecified: Secondary | ICD-10-CM | POA: Diagnosis not present

## 2017-03-02 DIAGNOSIS — E039 Hypothyroidism, unspecified: Secondary | ICD-10-CM

## 2017-03-02 DIAGNOSIS — E119 Type 2 diabetes mellitus without complications: Secondary | ICD-10-CM

## 2017-03-02 DIAGNOSIS — F419 Anxiety disorder, unspecified: Secondary | ICD-10-CM

## 2017-03-02 DIAGNOSIS — H8112 Benign paroxysmal vertigo, left ear: Secondary | ICD-10-CM

## 2017-03-02 NOTE — Progress Notes (Signed)
Location:   [facility] Room Number: [place] of Service:  ALF [phone]) Provider:  Toni Arthurs, NP-C  [name], MD  Patient Care Team: [name], MD as PCP - General (Internal Medicine)  Extended Emergency Contact Information Primary Emergency Contact: Irwin,Cheryl E Address: [address]          [place], [place] [id number] Johnnette Litter of [place] Phone: [phone] Mobile Phone: [phone] Relation: Daughter Secondary Emergency Contact: Philippa Sicks [place] of [place] Phone: [phone] Mobile Phone: [phone] Relation: Daughter  Code Status:  Full  Goals of care: Advanced Directive information Advanced Directives 03/02/2017  Does Patient Have a Medical Advance Directive? No  Would patient like information on creating a medical advance directive? -     Chief Complaint  Patient presents with  . Medical management of chronic illnesses    Routine visit    HPI:  Pt is a 83 y.o. female seen today for Medical management of chronic illnesses.  Pt denies n/v/d/f/c/cp/sob/ha/abd pain/dizziness/cough/pain.  No falls.  The patient is friendly and sociable.  Enjoys spending time with others.  Is always well dressed.  Patient reports appetite is good and has regular BMs.  Staff does report an increase in agitation and irritability as of late.  Will monitor for ongoing issues.  Vital signs stable.  No other complaints.   Past Medical History:  Diagnosis Date  . Diabetes mellitus ([place])    diet controlled  . Diabetes mellitus without complication ([place])   . Dry eye syndrome   . Hyperlipidemia, unspecified   . Hypertension   . Inflammatory arthritis   . Osteoporosis   . Postmenopausal    Past Surgical History:  Procedure Laterality Date  . ABDOMINAL HYSTERECTOMY    . APPENDECTOMY    . CAPSULOTOMY     OS  . CATARACT EXTRACTION W/PHACO Right 08/2008   crystalens 24.0D  . CATARACT EXTRACTION W/PHACO Left 01/18/2009   Crystalens 24.5D    Allergies  Allergen Reactions  . Epinephrine Other (See Comments)    Other Reaction: Passed out after dental inject  . Latex Other (See Comments)    Other Reaction: angioedema, mouth burns  . Levofloxacin     Other reaction(s): Hallucination  . Other Other (See Comments)    Uncoded Allergy. Allergen: ENVIRONMENTAL ALLERGIES    Allergies as of 03/02/2017      Reactions   Epinephrine Other (See Comments)   Other Reaction: Passed out after dental inject   Latex Other (See Comments)   Other Reaction: angioedema, mouth burns   Levofloxacin    Other reaction(s): Hallucination   Other Other (See Comments)   Uncoded Allergy. Allergen: ENVIRONMENTAL ALLERGIES      Medication List       Accurate as of 03/02/17  8:49 AM. Always use your most recent med list.          atorvastatin 10 MG tablet Commonly known as:  LIPITOR Take 10 mg by mouth daily.   enalapril 20 MG tablet Commonly known as:  VASOTEC Take 20 mg by mouth 2 (two) times daily. 8 am and 5 pm   levothyroxine 50 MCG tablet Commonly known as:  SYNTHROID, LEVOTHROID Take 50 mcg by mouth daily before breakfast. 6 am   metFORMIN 500 MG tablet Commonly known as:  GLUCOPHAGE Take 500 mg by mouth 2 (two) times daily with a meal. 8 am and 5 pm   mupirocin cream 2 % Commonly known as:  [employer]  Apply 1 application topically daily. Apply topically to pubic lesions   potassium chloride 10 MEQ tablet Commonly known as:  K-DUR,KLOR-CON Take 20 mEq by mouth daily. 2 tabs   rivastigmine 9.5 mg/24hr Commonly known as:  EXELON Place 9.5 mg onto the skin daily. 8 am   TYLENOL ARTHRITIS PAIN 650 MG CR tablet Generic drug:  acetaminophen Take 650 mg by mouth 2 (two) times daily.       Review of Systems  Constitutional: Negative for activity change, appetite change, chills, diaphoresis and fever.  HENT: Negative for congestion, sneezing, sore throat, trouble swallowing and voice change.   Eyes:  Negative for pain, redness and visual disturbance.  Respiratory: Negative for apnea, cough, choking, chest tightness, shortness of breath and wheezing.   Cardiovascular: Negative for chest pain, palpitations and leg swelling.  Gastrointestinal: Negative for abdominal distention, abdominal pain, constipation, diarrhea and nausea.  Genitourinary: Negative for difficulty urinating, dysuria, flank pain, frequency, hematuria, urgency and vaginal pain.  Musculoskeletal: Positive for arthralgias (typical arthritis). Negative for back pain, gait problem and myalgias.  Skin: Negative for color change, pallor, rash and wound.  Neurological: Negative for dizziness, tremors, syncope, speech difficulty, weakness, numbness and headaches.  Psychiatric/Behavioral: Negative for agitation and behavioral problems.  All other systems reviewed and are negative.   Immunization History  Administered Date(s) Administered  . Influenza Inj Mdck Quad Pf 03/26/2016  . Influenza-Unspecified 02/16/2014, 02/17/2017  . Pneumococcal Polysaccharide-23 07/30/2015   Pertinent  Health Maintenance Due  Topic Date Due  . FOOT EXAM  12/29/1939  . OPHTHALMOLOGY EXAM  12/29/1939  . DEXA SCAN  12/29/1996  . PNA vac Low Risk Adult (2 of 2 - PCV13) 07/29/2016  . HEMOGLOBIN A1C  04/29/2017  . INFLUENZA VACCINE  Completed   No flowsheet data found. Functional Status Survey:    Vitals:   03/02/17 0843  Temp: 98.9 F (37.2 C)  Weight: 138 lb 4.8 oz (62.7 kg)  Height: 4\' 11"  (1.499 m)   Body mass index is 27.93 kg/m. Physical Exam  Constitutional: She is oriented to person, place, and time. Vital signs are normal. She appears well-developed and well-nourished. She is active and cooperative. She does not appear ill. No distress.  HENT:  Head: Normocephalic and atraumatic.  Mouth/Throat: Uvula is midline, oropharynx is clear and moist and mucous membranes are normal. Mucous membranes are not pale, not dry and not cyanotic.    Eyes: Pupils are equal, round, and reactive to light. Conjunctivae, EOM and lids are normal.  Neck: Trachea normal, normal range of motion and full passive range of motion without pain. Neck supple. No JVD present. No tracheal deviation, no edema and no erythema present. No thyromegaly present.  Cardiovascular: Normal rate, regular rhythm, normal heart sounds, intact distal pulses and normal pulses.  Exam reveals no gallop, no distant heart sounds and no friction rub.   No murmur heard. Pulses:      Dorsalis pedis pulses are 2+ on the right side, and 2+ on the left side.  No edema  Pulmonary/Chest: Effort normal and breath sounds normal. No accessory muscle usage. No respiratory distress. She has no decreased breath sounds. She has no wheezes. She has no rhonchi. She has no rales. She exhibits no tenderness.  Abdominal: Normal appearance and bowel sounds are normal. She exhibits no distension and no ascites. There is no tenderness.  Musculoskeletal: Normal range of motion. She exhibits no edema or tenderness.  Expected osteoarthritis, stiffness  Neurological: She is alert and oriented  to person, place, and time. She has normal strength.  Generalized weakness  Skin: Skin is warm, dry and intact. No rash noted. She is not diaphoretic. No cyanosis or erythema. No pallor. Nails show no clubbing.  Psychiatric: She has a normal mood and affect. Her speech is normal and behavior is normal. Judgment and thought content normal. Cognition and memory are impaired. She exhibits abnormal recent memory.  Nursing note and vitals reviewed.   Labs reviewed:  Recent Labs  11/06/16 1748 11/10/16 0530  NA 132* 135  K 4.0 4.6  CL 103 106  CO2 19* 18*  GLUCOSE 159* 204*  BUN 34* 25*  CREATININE 0.70 0.67  CALCIUM 10.3 9.5    Recent Labs  11/10/16 0530  AST 19  ALT 23  ALKPHOS 60  BILITOT 1.0  PROT 7.3  ALBUMIN 4.3    Recent Labs  11/06/16 1748 11/10/16 0530  WBC 15.6* 10.8  NEUTROABS   --  7.0*  HGB 12.9 13.2  HCT 37.8 39.4  MCV 88.1 89.9  PLT 317 307   Lab Results  Component Value Date   TSH 1.55 10/28/2016   Lab Results  Component Value Date   HGBA1C 6.9 10/28/2016   No results found for: CHOL, HDL, LDLCALC, LDLDIRECT, TRIG, CHOLHDL  Significant Diagnostic Results in last 30 days:  No results found.  Assessment/Plan 1.  Alzheimer's disease with late onset (CODE)  Stable   Continue Exelon patch 9.5 mg TD Q Day  2. Dementia in other diseases classified elsewhere without behavioral disturbance  Stable   3. Benign paroxysmal vertigo of left ear  Stable  4. Diabetes mellitus without complication (HCC)  Stable  Continue Metformin 500 mg po BID  5. Essential (primary) hypertension  Stable   Continue Enalapril 20 mg po BID  6. Hyperlipidemia, unspecified hyperlipidemia type  Stable   Continue Atorvastatin 10 mg po Q Day  7. Hypothyroidism, unspecified type  Stable  Continue Synthroid 50 mcg po Q Day  8. Hypokalemia  Stable   Continue Klor-Con 10 meq- 2 tablets po Q Day  9. Anxiety disorder, unspecified type  Stable   10. Osteoarthritis, unspecified osteoarthritis type, unspecified site  Stable  Continue Tylenol SR 650 mg - 2 tablets po BID   Continue all medications as listed above unless otherwise indicated  Family/ staff Communication:   Total Time:  Documentation:  Face to Face:  Family/Phone:   Labs/tests ordered: Not due  Medication list reviewed and assessed for continued appropriateness. Monthly medication orders reviewed and signed.  Vikki Ports, NP-C Geriatrics [facility] [phone], [place] [address] Cell Phone (Mon-Fri 8am-5pm):  [phone] On Call:  [phone] & follow prompts after 5pm & weekends Office Phone:  [phone] Office Fax:  [phone]

## 2017-03-26 ENCOUNTER — Encounter
Admission: RE | Admit: 2017-03-26 | Discharge: 2017-03-26 | Disposition: A | Discharge status: Home / Self Care | Payer: Medicare Other | Source: Ambulatory Visit | Attending: Internal Medicine | Admitting: Internal Medicine

## 2017-04-01 ENCOUNTER — Non-Acute Institutional Stay: Payer: Medicare Other | Admitting: Gerontology

## 2017-04-01 ENCOUNTER — Encounter: Payer: Self-pay | Admitting: Gerontology

## 2017-04-01 DIAGNOSIS — F0280 Dementia in other diseases classified elsewhere without behavioral disturbance: Secondary | ICD-10-CM

## 2017-04-01 DIAGNOSIS — I10 Essential (primary) hypertension: Secondary | ICD-10-CM | POA: Diagnosis not present

## 2017-04-01 DIAGNOSIS — M1990 Unspecified osteoarthritis, unspecified site: Secondary | ICD-10-CM | POA: Diagnosis not present

## 2017-04-01 DIAGNOSIS — H8112 Benign paroxysmal vertigo, left ear: Secondary | ICD-10-CM | POA: Diagnosis not present

## 2017-04-01 DIAGNOSIS — E119 Type 2 diabetes mellitus without complications: Secondary | ICD-10-CM

## 2017-04-01 DIAGNOSIS — F419 Anxiety disorder, unspecified: Secondary | ICD-10-CM | POA: Diagnosis not present

## 2017-04-01 DIAGNOSIS — E876 Hypokalemia: Secondary | ICD-10-CM | POA: Diagnosis not present

## 2017-04-01 DIAGNOSIS — E039 Hypothyroidism, unspecified: Secondary | ICD-10-CM

## 2017-04-01 DIAGNOSIS — G301 Alzheimer's disease with late onset: Secondary | ICD-10-CM | POA: Diagnosis not present

## 2017-04-01 DIAGNOSIS — E785 Hyperlipidemia, unspecified: Secondary | ICD-10-CM

## 2017-04-13 ENCOUNTER — Other Ambulatory Visit
Admission: RE | Admit: 2017-04-13 | Discharge: 2017-04-13 | Disposition: A | Discharge status: Home / Self Care | Payer: Medicare Other | Source: Ambulatory Visit | Attending: Gerontology | Admitting: Gerontology

## 2017-04-13 ENCOUNTER — Non-Acute Institutional Stay: Payer: Medicare Other | Admitting: Gerontology

## 2017-04-13 DIAGNOSIS — J069 Acute upper respiratory infection, unspecified: Secondary | ICD-10-CM

## 2017-04-13 DIAGNOSIS — R05 Cough: Secondary | ICD-10-CM | POA: Insufficient documentation

## 2017-04-13 DIAGNOSIS — R509 Fever, unspecified: Secondary | ICD-10-CM | POA: Insufficient documentation

## 2017-04-13 LAB — INFLUENZA PANEL BY PCR (TYPE A & B)
INFLAPCR: NEGATIVE
INFLBPCR: NEGATIVE

## 2017-04-14 ENCOUNTER — Encounter: Payer: Self-pay | Admitting: Gerontology

## 2017-04-14 NOTE — Progress Notes (Signed)
Location:   [facility] Room Number: [phone] Place of Service:  ALF [phone]) Provider:  Toni Arthurs, NP-C  Frazier Richards, MD  Patient Care Team: Frazier Richards, MD as PCP - General (Internal Medicine)  Extended Emergency Contact Information Primary Emergency Contact: Irwin,Cheryl E Address: [address]          [place], [place] [id number] Johnnette Litter of [place] Phone: [phone] Mobile Phone: [phone] Relation: Daughter Secondary Emergency Contact: Philippa Sicks [place] of [place] Phone: [phone] Mobile Phone: [phone] Relation: Daughter  Code Status:  FULL Goals of care: Advanced Directive information Advanced Directives 04/14/2017  Does Patient Have a Medical Advance Directive? No  Would patient like information on creating a medical advance directive? No - Patient declined     Chief Complaint  Patient presents with  . Acute Visit    Recheck patient's cough    HPI:  Pt is a 83 y.o. female seen today for an acute visit for cough and congestion.  Staff reports patient has been running a low-grade fever over the weekend, with general malaise and loss of appetite.  No nausea or vomiting or diarrhea.  Patient reports cough is nonproductive but coarse sounding.  Patient reports feeling weak and "hurting all over."  Verbal order was given over the weekend to obtain 2 view chest x-ray.  Negative for pneumonia.  Today, patient reports she is still feeling bad, but is feeling a little better than she did yesterday.  Patient was assessed laying in bed, taking a nap, which is not typical for her.  Aside from low-grade temp, vital signs stable.  No other complaints.   Past Medical History:  Diagnosis Date  . Diabetes mellitus ([place])    diet controlled  . Diabetes mellitus without complication ([place])   . Dry eye syndrome   . Hyperlipidemia, unspecified   . Hypertension   . Inflammatory arthritis   . Osteoporosis   .  Postmenopausal    Past Surgical History:  Procedure Laterality Date  . ABDOMINAL HYSTERECTOMY    . APPENDECTOMY    . CAPSULOTOMY     OS  . CATARACT EXTRACTION W/PHACO Right 08/2008   crystalens 24.0D  . CATARACT EXTRACTION W/PHACO Left 01/18/2009   Crystalens 24.5D    Allergies  Allergen Reactions  . Epinephrine Other (See Comments)    Other Reaction: Passed out after dental inject  . Latex Other (See Comments)    Other Reaction: angioedema, mouth burns  . Levofloxacin     Other reaction(s): Hallucination  . Other Other (See Comments)    Uncoded Allergy. Allergen: ENVIRONMENTAL ALLERGIES    Allergies as of 04/13/2017      Reactions   Epinephrine Other (See Comments)   Other Reaction: Passed out after dental inject   Latex Other (See Comments)   Other Reaction: angioedema, mouth burns   Levofloxacin    Other reaction(s): Hallucination   Other Other (See Comments)   Uncoded Allergy. Allergen: ENVIRONMENTAL ALLERGIES      Medication List        Accurate as of 04/13/17 11:59 PM. Always use your most recent med list.          atorvastatin 10 MG tablet Commonly known as:  LIPITOR Take 10 mg by mouth daily.   azithromycin 250 MG tablet Commonly known as:  ZITHROMAX See admin instructions. Give 2 tabs (500 mg) by mouth 11/19, then 1 tablet daily x 4 days for URI   enalapril 20  MG tablet Commonly known as:  VASOTEC Take 20 mg by mouth 2 (two) times daily. 8 am and 5 pm   ipratropium-albuterol 0.5-2.5 (3) MG/3ML Soln Commonly known as:  DUONEB Take 3 mLs by nebulization 3 (three) times daily.   levothyroxine 50 MCG tablet Commonly known as:  SYNTHROID, LEVOTHROID Take 50 mcg by mouth daily before breakfast. 6 am   metFORMIN 500 MG tablet Commonly known as:  GLUCOPHAGE Take 500 mg by mouth 2 (two) times daily with a meal. 8 am and 5 pm   mupirocin cream 2 % Commonly known as:  [employer] Apply 1 application topically daily. Apply topically to pubic  lesions   potassium chloride 10 MEQ tablet Commonly known as:  K-DUR,KLOR-CON Take 20 mEq by mouth daily. 2 tabs   rivastigmine 9.5 mg/24hr Commonly known as:  EXELON Place 9.5 mg onto the skin daily. 8 am   TYLENOL ARTHRITIS PAIN 650 MG CR tablet Generic drug:  acetaminophen Take 650 mg by mouth 2 (two) times daily.       Review of Systems  Constitutional: Positive for activity change, appetite change, fatigue and fever (low grade). Negative for chills and diaphoresis.  HENT: Positive for congestion. Negative for sinus pressure, sinus pain, sneezing, sore throat, trouble swallowing and voice change.   Respiratory: Positive for cough. Negative for apnea, choking, chest tightness, shortness of breath and wheezing.   Cardiovascular: Negative for chest pain, palpitations and leg swelling.  Gastrointestinal: Negative for abdominal distention, abdominal pain, constipation, diarrhea and nausea.  Genitourinary: Negative for difficulty urinating, dysuria, frequency and urgency.  Musculoskeletal: Positive for myalgias. Negative for back pain and gait problem. Arthralgias: typical arthritis.  Skin: Negative for color change, pallor, rash and wound.  Neurological: Positive for weakness. Negative for dizziness, tremors, syncope, speech difficulty, numbness and headaches.  Psychiatric/Behavioral: Negative for agitation and behavioral problems.  All other systems reviewed and are negative.   Immunization History  Administered Date(s) Administered  . Influenza Inj Mdck Quad Pf 03/26/2016  . Influenza-Unspecified 02/16/2014, 02/17/2017  . Pneumococcal Polysaccharide-23 07/30/2015   Pertinent  Health Maintenance Due  Topic Date Due  . FOOT EXAM  12/29/1939  . OPHTHALMOLOGY EXAM  12/29/1939  . DEXA SCAN  12/29/1996  . PNA vac Low Risk Adult (2 of 2 - PCV13) 07/29/2016  . HEMOGLOBIN A1C  04/29/2017  . INFLUENZA VACCINE  Completed   No flowsheet data found. Functional Status Survey:     Vitals:   04/13/17 1524  BP: (!) 141/66  Pulse: (!) 106  Resp: 18  Temp: 99.4 F (37.4 C)  TempSrc: Oral  SpO2: 99%  Weight: 138 lb 4.8 oz (62.7 kg)  Height: 4\' 11"  (1.499 m)   Body mass index is 27.93 kg/m. Physical Exam  Constitutional: She is oriented to person, place, and time. Vital signs are normal. She appears well-developed and well-nourished. She is active and cooperative. She does not appear ill. No distress.  HENT:  Head: Normocephalic and atraumatic.  Mouth/Throat: Uvula is midline, oropharynx is clear and moist and mucous membranes are normal. Mucous membranes are not pale, not dry and not cyanotic.  Eyes: Conjunctivae, EOM and lids are normal. Pupils are equal, round, and reactive to light.  Neck: Trachea normal, normal range of motion and full passive range of motion without pain. Neck supple. No JVD present. No tracheal deviation, no edema and no erythema present. No thyromegaly present.  Cardiovascular: Normal rate, regular rhythm, normal heart sounds, intact distal pulses and normal pulses. Exam  reveals no gallop, no distant heart sounds and no friction rub.  No murmur heard. Pulses:      Dorsalis pedis pulses are 2+ on the right side, and 2+ on the left side.  Pulmonary/Chest: Effort normal. No accessory muscle usage. No respiratory distress. She has decreased breath sounds in the right lower field and the left lower field. She has no wheezes. She has no rhonchi. She has no rales. She exhibits no tenderness.  Abdominal: Soft. Normal appearance and bowel sounds are normal. She exhibits no distension and no ascites. There is no tenderness.  Musculoskeletal: Normal range of motion. She exhibits no edema or tenderness.  Expected osteoarthritis, stiffness  Neurological: She is alert and oriented to person, place, and time. She has normal strength.  Skin: Skin is warm, dry and intact. She is not diaphoretic. No cyanosis. No pallor. Nails show no clubbing.  Psychiatric:  She has a normal mood and affect. Her speech is normal and behavior is normal. Judgment and thought content normal. Cognition and memory are impaired. She exhibits abnormal recent memory.  Nursing note and vitals reviewed.   Labs reviewed: Recent Labs    11/06/16 1748 11/10/16 0530  NA 132* 135  K 4.0 4.6  CL 103 106  CO2 19* 18*  GLUCOSE 159* 204*  BUN 34* 25*  CREATININE 0.70 0.67  CALCIUM 10.3 9.5   Recent Labs    11/10/16 0530  AST 19  ALT 23  ALKPHOS 60  BILITOT 1.0  PROT 7.3  ALBUMIN 4.3   Recent Labs    11/06/16 1748 11/10/16 0530  WBC 15.6* 10.8  NEUTROABS  --  7.0*  HGB 12.9 13.2  HCT 37.8 39.4  MCV 88.1 89.9  PLT 317 307   Lab Results  Component Value Date   TSH 1.55 10/28/2016   Lab Results  Component Value Date   HGBA1C 6.9 10/28/2016   No results found for: CHOL, HDL, LDLCALC, LDLDIRECT, TRIG, CHOLHDL  Significant Diagnostic Results in last 30 days:  No results found.  Assessment/Plan 1.  Upper respiratory tract infection, unspecified type  2 view chest x-ray (obtained yesterday)  Rocephin 2 g IM x1 (given yesterday)  Rapid flu test  Duo nebs 3 times daily scheduled times 3 days  Azithromycin 250 mg 2 tablets today, then 1 tablet daily times 4 days  As needed guaifenesin per standing order protocol  Family/ staff Communication:   Total Time:  Documentation:  Face to Face:  Family/Phone:   Labs/tests ordered: 2 view chest x-ray, rapid flu test  Medication list reviewed and assessed for continued appropriateness.  Vikki Ports, NP-C Geriatrics [facility] [phone], [place] [address] Cell Phone (Mon-Fri 8am-5pm):  [phone] On Call:  [phone] & follow prompts after 5pm & weekends Office Phone:  [phone] Office Fax:  [phone]

## 2017-04-18 ENCOUNTER — Other Ambulatory Visit
Admission: RE | Admit: 2017-04-18 | Discharge: 2017-04-18 | Disposition: A | Discharge status: Home / Self Care | Payer: Medicare Other | Source: Other Acute Inpatient Hospital | Attending: Gerontology | Admitting: Gerontology

## 2017-04-18 DIAGNOSIS — R4182 Altered mental status, unspecified: Secondary | ICD-10-CM | POA: Insufficient documentation

## 2017-04-18 DIAGNOSIS — R410 Disorientation, unspecified: Secondary | ICD-10-CM | POA: Insufficient documentation

## 2017-04-18 LAB — COMPREHENSIVE METABOLIC PANEL
ALBUMIN: 3.3 g/dL — AB (ref 3.5–5.0)
ALK PHOS: 100 U/L (ref 38–126)
ALT: 17 U/L (ref 14–54)
AST: 25 U/L (ref 15–41)
Anion gap: 10 (ref 5–15)
BILIRUBIN TOTAL: 0.5 mg/dL (ref 0.3–1.2)
BUN: 13 mg/dL (ref 6–20)
CO2: 24 mmol/L (ref 22–32)
CREATININE: 0.57 mg/dL (ref 0.44–1.00)
Calcium: 9.5 mg/dL (ref 8.9–10.3)
Chloride: 105 mmol/L (ref 101–111)
GFR calc Af Amer: >60 mL/min (ref >60)
GLUCOSE: 195 mg/dL — AB (ref 65–99)
POTASSIUM: 3.8 mmol/L (ref 3.5–5.1)
Sodium: 139 mmol/L (ref 135–145)
TOTAL PROTEIN: 7 g/dL (ref 6.5–8.1)

## 2017-04-18 LAB — CBC WITH DIFFERENTIAL/PLATELET
BASOS ABS: 0 10*3/uL (ref 0–0.1)
Basophils Relative: 0 %
EOS ABS: 0.2 10*3/uL (ref 0–0.7)
EOS PCT: 3 %
HCT: 34.6 % — ABNORMAL LOW (ref 35.0–47.0)
Hemoglobin: 11.6 g/dL — ABNORMAL LOW (ref 12.0–16.0)
LYMPHS PCT: 32 %
Lymphs Abs: 2.5 10*3/uL (ref 1.0–3.6)
MCH: 29.6 pg (ref 26.0–34.0)
MCHC: 33.7 g/dL (ref 32.0–36.0)
MCV: 87.8 fL (ref 80.0–100.0)
MONO ABS: 0.6 10*3/uL (ref 0.2–0.9)
Monocytes Relative: 8 %
Neutro Abs: 4.4 10*3/uL (ref 1.4–6.5)
Neutrophils Relative %: 57 %
PLATELETS: 331 10*3/uL (ref 150–440)
RBC: 3.94 MIL/uL (ref 3.80–5.20)
RDW: 13.6 % (ref 11.5–14.5)
WBC: 7.7 10*3/uL (ref 3.6–11.0)

## 2017-04-25 ENCOUNTER — Encounter
Admission: RE | Admit: 2017-04-25 | Discharge: 2017-04-25 | Disposition: A | Discharge status: Home / Self Care | Payer: Medicare Other | Source: Ambulatory Visit | Attending: Internal Medicine | Admitting: Internal Medicine

## 2017-05-07 ENCOUNTER — Non-Acute Institutional Stay: Payer: Medicare Other | Admitting: Gerontology

## 2017-05-07 ENCOUNTER — Encounter: Payer: Self-pay | Admitting: Gerontology

## 2017-05-07 DIAGNOSIS — E119 Type 2 diabetes mellitus without complications: Secondary | ICD-10-CM | POA: Diagnosis not present

## 2017-05-07 DIAGNOSIS — F0280 Dementia in other diseases classified elsewhere without behavioral disturbance: Secondary | ICD-10-CM | POA: Diagnosis not present

## 2017-05-07 DIAGNOSIS — G301 Alzheimer's disease with late onset: Secondary | ICD-10-CM | POA: Diagnosis not present

## 2017-05-07 DIAGNOSIS — F419 Anxiety disorder, unspecified: Secondary | ICD-10-CM | POA: Diagnosis not present

## 2017-05-07 DIAGNOSIS — E876 Hypokalemia: Secondary | ICD-10-CM | POA: Diagnosis not present

## 2017-05-07 DIAGNOSIS — E785 Hyperlipidemia, unspecified: Secondary | ICD-10-CM

## 2017-05-07 DIAGNOSIS — I10 Essential (primary) hypertension: Secondary | ICD-10-CM | POA: Diagnosis not present

## 2017-05-07 DIAGNOSIS — E039 Hypothyroidism, unspecified: Secondary | ICD-10-CM

## 2017-05-07 DIAGNOSIS — M1990 Unspecified osteoarthritis, unspecified site: Secondary | ICD-10-CM | POA: Diagnosis not present

## 2017-05-07 DIAGNOSIS — H8112 Benign paroxysmal vertigo, left ear: Secondary | ICD-10-CM

## 2017-05-24 NOTE — Progress Notes (Signed)
Location:   [facility] Room Number: [id number] Place of Service:  ALF [phone]) Provider:  Toni Arthurs, NP-C  [name], MD  Patient Care Team: [name], MD as PCP - General (Internal Medicine)  Extended Emergency Contact Information Primary Emergency Contact: Irwin,Cheryl E Address: [address]          [place], [place] [id number] Johnnette Litter of [place] Phone: [phone] Mobile Phone: [phone] Relation: Daughter Secondary Emergency Contact: Philippa Sicks [place] of [place] Phone: [phone] Mobile Phone: [phone] Relation: Daughter  Code Status:  Full  Goals of care: Advanced Directive information Advanced Directives 05/07/2017  Does Patient Have a Medical Advance Directive? No  Would patient like information on creating a medical advance directive? No - Patient declined     Chief Complaint  Patient presents with  . Medical management of chronic illnesses    Routine visit    HPI:  Pt is a 83 y.o. female seen today for Medical management of chronic illnesses.  Pt denies n/v/d/f/c/cp/sob/ha/abd pain/dizziness/cough/congestion/pain. No fall this past month. The pt is friendly and sociable. Enjoys spending time with others. Is always well dressed. Pt reports appetite is good and has regular BMs. Recent increase in intermittent agitation and irritability, but no ongoing issues now. VSS. No other complaints.   Past Medical History:  Diagnosis Date  . Diabetes mellitus ([place])    diet controlled  . Diabetes mellitus without complication ([place])   . Dry eye syndrome   . Hyperlipidemia, unspecified   . Hypertension   . Inflammatory arthritis   . Osteoporosis   . Postmenopausal    Past Surgical History:  Procedure Laterality Date  . ABDOMINAL HYSTERECTOMY    . APPENDECTOMY    . CAPSULOTOMY     OS  . CATARACT EXTRACTION W/PHACO Right 08/2008   crystalens 24.0D  . CATARACT EXTRACTION W/PHACO Left 01/18/2009   Crystalens 24.5D    Allergies  Allergen Reactions  . Epinephrine Other (See Comments)    Other Reaction: Passed out after dental inject  . Latex Other (See Comments)    Other Reaction: angioedema, mouth burns  . Levofloxacin     Other reaction(s): Hallucination  . Other Other (See Comments)    Uncoded Allergy. Allergen: ENVIRONMENTAL ALLERGIES    Allergies as of 04/01/2017      Reactions   Epinephrine Other (See Comments)   Other Reaction: Passed out after dental inject   Latex Other (See Comments)   Other Reaction: angioedema, mouth burns   Levofloxacin    Other reaction(s): Hallucination   Other Other (See Comments)   Uncoded Allergy. Allergen: ENVIRONMENTAL ALLERGIES      Medication List        Accurate as of 04/01/17 11:59 PM. Always use your most recent med list.          atorvastatin 10 MG tablet Commonly known as:  LIPITOR Take 10 mg by mouth daily.   enalapril 20 MG tablet Commonly known as:  VASOTEC Take 20 mg by mouth 2 (two) times daily. 8 am and 5 pm   levothyroxine 50 MCG tablet Commonly known as:  SYNTHROID, LEVOTHROID Take 50 mcg by mouth daily before breakfast. 6 am   metFORMIN 500 MG tablet Commonly known as:  GLUCOPHAGE Take 500 mg by mouth 2 (two) times daily with a meal. 8 am and 5 pm   mupirocin cream 2 % Commonly known as:  BACTROBAN Apply 1 application topically daily. Apply topically to pubic lesions  potassium chloride 10 MEQ tablet Commonly known as:  K-DUR,KLOR-CON Take 20 mEq by mouth daily. 2 tabs   rivastigmine 9.5 mg/24hr Commonly known as:  EXELON Place 9.5 mg onto the skin daily. 8 am   TYLENOL ARTHRITIS PAIN 650 MG CR tablet Generic drug:  acetaminophen Take 650 mg by mouth 2 (two) times daily.       Review of Systems  Constitutional: Negative for activity change, appetite change, chills, diaphoresis and fever.  HENT: Negative for congestion, mouth sores, nosebleeds, postnasal drip, sneezing, sore throat, trouble  swallowing and voice change.   Eyes: Negative for pain, redness and visual disturbance.  Respiratory: Negative for apnea, cough, choking, chest tightness, shortness of breath and wheezing.   Cardiovascular: Negative for chest pain, palpitations and leg swelling.  Gastrointestinal: Negative for abdominal distention, abdominal pain, constipation, diarrhea and nausea.  Genitourinary: Negative for difficulty urinating, dysuria, flank pain, frequency, hematuria, urgency and vaginal pain.  Musculoskeletal: Positive for arthralgias (typical arthritis). Negative for back pain, gait problem and myalgias.  Skin: Negative for color change, pallor, rash and wound.  Neurological: Negative for dizziness, tremors, syncope, speech difficulty, weakness, numbness and headaches.  Psychiatric/Behavioral: Negative for agitation and behavioral problems.  All other systems reviewed and are negative.   Immunization History  Administered Date(s) Administered  . Influenza Inj Mdck Quad Pf 03/26/2016  . Influenza-Unspecified 02/16/2014, 02/17/2017  . Pneumococcal Polysaccharide-23 07/30/2015   Pertinent  Health Maintenance Due  Topic Date Due  . FOOT EXAM  12/29/1939  . OPHTHALMOLOGY EXAM  12/29/1939  . DEXA SCAN  12/29/1996  . PNA vac Low Risk Adult (2 of 2 - PCV13) 07/29/2016  . HEMOGLOBIN A1C  04/29/2017  . INFLUENZA VACCINE  Completed   No flowsheet data found. Functional Status Survey:    Vitals:   04/01/17 1400  BP: 136/66  Pulse: 89  Resp: 16  Temp: 97.9 F (36.6 C)  TempSrc: Oral  SpO2: 99%  Weight: 138 lb 4.8 oz (62.7 kg)  Height: 4' 11" (1.499 m)   Body mass index is 27.93 kg/m. Physical Exam  Constitutional: She is oriented to person, place, and time. Vital signs are normal. She appears well-developed and well-nourished. She is active and cooperative. She does not appear ill. No distress.  HENT:  Head: Normocephalic and atraumatic.  Mouth/Throat: Uvula is midline, oropharynx is  clear and moist and mucous membranes are normal. Mucous membranes are not pale, not dry and not cyanotic.  Eyes: Conjunctivae, EOM and lids are normal. Pupils are equal, round, and reactive to light.  Neck: Trachea normal, normal range of motion and full passive range of motion without pain. Neck supple. No JVD present. No tracheal deviation, no edema and no erythema present. No thyromegaly present.  Cardiovascular: Normal rate, regular rhythm, normal heart sounds, intact distal pulses and normal pulses. Exam reveals no gallop, no distant heart sounds and no friction rub.  No murmur heard. Pulses:      Dorsalis pedis pulses are 2+ on the right side, and 2+ on the left side.  No edema  Pulmonary/Chest: Effort normal and breath sounds normal. No accessory muscle usage. No respiratory distress. She has no decreased breath sounds. She has no wheezes. She has no rhonchi. She has no rales. She exhibits no tenderness.  Abdominal: Soft. Normal appearance and bowel sounds are normal. She exhibits no distension and no ascites. There is no tenderness.  Musculoskeletal: Normal range of motion. She exhibits no edema or tenderness.  Expected osteoarthritis, stiffness  Neurological: She is alert and oriented to person, place, and time. She has normal strength.  Generalized weakness  Skin: Skin is warm, dry and intact. No rash noted. She is not diaphoretic. No cyanosis or erythema. No pallor. Nails show no clubbing.  Psychiatric: She has a normal mood and affect. Her speech is normal and behavior is normal. Judgment and thought content normal. Cognition and memory are impaired. She exhibits abnormal recent memory.  Nursing note and vitals reviewed.   Labs reviewed: Recent Labs    11/06/16 1748 11/10/16 0530 04/18/17 0948  NA 132* 135 139  K 4.0 4.6 3.8  CL 103 106 105  CO2 19* 18* 24  GLUCOSE 159* 204* 195*  BUN 34* 25* 13  CREATININE 0.70 0.67 0.57  CALCIUM 10.3 9.5 9.5   Recent Labs     11/10/16 0530 04/18/17 0948  AST 19 25  ALT 23 17  ALKPHOS 60 100  BILITOT 1.0 0.5  PROT 7.3 7.0  ALBUMIN 4.3 3.3*   Recent Labs    11/06/16 1748 11/10/16 0530 04/18/17 0948  WBC 15.6* 10.8 7.7  NEUTROABS  --  7.0* 4.4  HGB 12.9 13.2 11.6*  HCT 37.8 39.4 34.6*  MCV 88.1 89.9 87.8  PLT 317 307 331   Lab Results  Component Value Date   TSH 1.55 10/28/2016   Lab Results  Component Value Date   HGBA1C 6.9 10/28/2016   No results found for: CHOL, HDL, LDLCALC, LDLDIRECT, TRIG, CHOLHDL  Significant Diagnostic Results in last 30 days:  No results found.  Assessment/Plan 1.  Alzheimer's disease with late onset (CODE)  Stable   Continue Exelon patch 9.5 mg TD Q Day  2. Dementia in other diseases classified elsewhere without behavioral disturbance  Stable   3. Benign paroxysmal vertigo of left ear  Stable  4. Diabetes mellitus without complication (HCC)  Stable  Continue Metformin 500 mg po BID  5. Essential (primary) hypertension  Stable   Continue Enalapril 20 mg po BID  6. Hyperlipidemia, unspecified hyperlipidemia type  Stable   Continue Atorvastatin 10 mg po Q Day  7. Hypothyroidism, unspecified type  Stable  Continue Synthroid 50 mcg po Q Day  8. Hypokalemia  Stable   Continue Klor-Con 10 meq- 2 tablets po Q Day  9. Anxiety disorder, unspecified type  Stable   10. Osteoarthritis, unspecified osteoarthritis type, unspecified site  Stable  Continue Tylenol SR 650 mg - 2 tablets po BID   Continue all medications as listed above (04/01/17)  Family/ staff Communication:   Total Time:  Documentation:  Face to Face:  Family/Phone:   Labs/tests ordered: CBC, Met C  Medication list reviewed and assessed for continued appropriateness. Monthly medication orders reviewed and signed.  Vikki Ports, NP-C Geriatrics [facility] [phone], [place] [address] Cell Phone (Mon-Fri  8am-5pm):  [phone] On Call:  [phone] & follow prompts after 5pm & weekends Office Phone:  [phone] Office Fax:  [phone]

## 2017-05-24 NOTE — Progress Notes (Signed)
Location:   [name] Room Number: [phone] Place of Service:  ALF [phone]) Provider:  Toni Arthurs, NP-C  [name], MD  Patient Care Team: [name], MD as PCP - General (Internal Medicine)  Extended Emergency Contact Information Primary Emergency Contact: Logan,Katie E Address: [address]          [place], [place] [id number] Katie Logan of [place] Phone: [phone] Mobile Phone: [phone] Relation: Daughter Secondary Emergency Contact: Philippa Logan [place] of [place] Phone: [phone] Mobile Phone: [phone] Relation: Daughter  Code Status:  Full  Goals of care: Advanced Directive information Advanced Directives 05/07/2017  Does Patient Have a Medical Advance Directive? No  Would patient like information on creating a medical advance directive? No - Patient declined     Chief Complaint  Patient presents with  . Medical management of chronic illnesses    Routine visit    HPI:  Pt is a 83 y.o. female seen today for Medical management of chronic illnesses.  Pt has been stable over the past month, except episode of URI 3 weeks ago. Pt had not had any further issues. Pt denies n/v/d/f/c/cp/sob/ha/abd pain/dizziness/cough congestion/pain. No falls this past month. The pt is friendly and sociable. Enjoys spending time with others. Is always well dressed. Pt reports appetite is good and has regular BMs. No issues at this time with agitation. VSS. No other complaints.    Past Medical History:  Diagnosis Date  . Diabetes mellitus ([place])    diet controlled  . Diabetes mellitus without complication ([employer])   . Dry eye syndrome   . Hyperlipidemia, unspecified   . Hypertension   . Inflammatory arthritis   . Osteoporosis   . Postmenopausal    Past Surgical History:  Procedure Laterality Date  . ABDOMINAL HYSTERECTOMY    . APPENDECTOMY    . CAPSULOTOMY     OS  . CATARACT EXTRACTION W/PHACO Right 08/2008   crystalens  24.0D  . CATARACT EXTRACTION W/PHACO Left 01/18/2009   Crystalens 24.5D    Allergies  Allergen Reactions  . Epinephrine Other (See Comments)    Other Reaction: Passed out after dental inject  . Latex Other (See Comments)    Other Reaction: angioedema, mouth burns  . Levofloxacin     Other reaction(s): Hallucination  . Other Other (See Comments)    Uncoded Allergy. Allergen: ENVIRONMENTAL ALLERGIES    Allergies as of 05/07/2017      Reactions   Epinephrine Other (See Comments)   Other Reaction: Passed out after dental inject   Latex Other (See Comments)   Other Reaction: angioedema, mouth burns   Levofloxacin    Other reaction(s): Hallucination   Other Other (See Comments)   Uncoded Allergy. Allergen: ENVIRONMENTAL ALLERGIES      Medication List        Accurate as of 05/07/17 11:59 PM. Always use your most recent med list.          atorvastatin 10 MG tablet Commonly known as:  LIPITOR Take 10 mg by mouth daily.   enalapril 20 MG tablet Commonly known as:  VASOTEC Take 20 mg by mouth 2 (two) times daily. 8 am and 5 pm   levothyroxine 50 MCG tablet Commonly known as:  SYNTHROID, LEVOTHROID Take 50 mcg by mouth daily before breakfast. 6 am   metFORMIN 500 MG tablet Commonly known as:  GLUCOPHAGE Take 500 mg by mouth 2 (two) times daily with a meal. 8 am and 5 pm  mupirocin cream 2 % Commonly known as:  BACTROBAN Apply 1 application topically daily. Apply topically to pubic lesions   potassium chloride 10 MEQ tablet Commonly known as:  K-DUR,KLOR-CON Take 20 mEq by mouth daily. 2 tabs   rivastigmine 9.5 mg/24hr Commonly known as:  EXELON Place 9.5 mg onto the skin daily. 8 am   TYLENOL ARTHRITIS PAIN 650 MG CR tablet Generic drug:  acetaminophen Take 650 mg by mouth 2 (two) times daily.       Review of Systems  Constitutional: Negative for activity change, appetite change, chills, diaphoresis and fever.  HENT: Negative for congestion, mouth sores,  nosebleeds, postnasal drip, sneezing, sore throat, trouble swallowing and voice change.   Eyes: Negative for pain, redness and visual disturbance.  Respiratory: Negative for apnea, cough, choking, chest tightness, shortness of breath and wheezing.   Cardiovascular: Negative for chest pain, palpitations and leg swelling.  Gastrointestinal: Negative for abdominal distention, abdominal pain, constipation, diarrhea and nausea.  Genitourinary: Negative for difficulty urinating, dysuria, flank pain, frequency, hematuria, urgency and vaginal pain.  Musculoskeletal: Positive for arthralgias (typical arthritis). Negative for back pain, gait problem and myalgias.  Skin: Negative for color change, pallor, rash and wound.  Neurological: Negative for dizziness, tremors, syncope, speech difficulty, weakness, numbness and headaches.  Psychiatric/Behavioral: Negative for agitation and behavioral problems.  All other systems reviewed and are negative.   Immunization History  Administered Date(s) Administered  . Influenza Inj Mdck Quad Pf 03/26/2016  . Influenza-Unspecified 02/16/2014, 02/17/2017  . Pneumococcal Polysaccharide-23 07/30/2015   Pertinent  Health Maintenance Due  Topic Date Due  . FOOT EXAM  12/29/1939  . OPHTHALMOLOGY EXAM  12/29/1939  . DEXA SCAN  12/29/1996  . PNA vac Low Risk Adult (2 of 2 - PCV13) 07/29/2016  . HEMOGLOBIN A1C  04/29/2017  . INFLUENZA VACCINE  Completed   No flowsheet data found. Functional Status Survey:    Vitals:   05/07/17 1317  BP: 130/60  Pulse: 89  Resp: 18  Temp: 97.9 F (36.6 C)  TempSrc: Oral  SpO2: 96%  Weight: 133 lb 14.4 oz (60.7 kg)  Height: 4\' 11"  (1.499 m)   Body mass index is 27.04 kg/m. Physical Exam  Constitutional: She is oriented to person, place, and time. Vital signs are normal. She appears well-developed and well-nourished. She is active and cooperative. She does not appear ill. No distress.  HENT:  Head: Normocephalic and  atraumatic.  Mouth/Throat: Uvula is midline, oropharynx is clear and moist and mucous membranes are normal. Mucous membranes are not pale, not dry and not cyanotic.  Eyes: Conjunctivae, EOM and lids are normal. Pupils are equal, round, and reactive to light.  Neck: Trachea normal, normal range of motion and full passive range of motion without pain. Neck supple. No JVD present. No tracheal deviation, no edema and no erythema present. No thyromegaly present.  Cardiovascular: Normal rate, regular rhythm, normal heart sounds, intact distal pulses and normal pulses. Exam reveals no gallop, no distant heart sounds and no friction rub.  No murmur heard. Pulses:      Dorsalis pedis pulses are 2+ on the right side, and 2+ on the left side.  No edema  Pulmonary/Chest: Effort normal and breath sounds normal. No accessory muscle usage. No respiratory distress. She has no decreased breath sounds. She has no wheezes. She has no rhonchi. She has no rales. She exhibits no tenderness.  Abdominal: Soft. Normal appearance and bowel sounds are normal. She exhibits no distension and no ascites.  There is no tenderness.  Musculoskeletal: Normal range of motion. She exhibits no edema or tenderness.  Expected osteoarthritis, stiffness  Neurological: She is alert and oriented to person, place, and time. She has normal strength.  Generalized weakness  Skin: Skin is warm, dry and intact. No rash noted. She is not diaphoretic. No cyanosis or erythema. No pallor. Nails show no clubbing.  Psychiatric: She has a normal mood and affect. Her speech is normal and behavior is normal. Judgment and thought content normal. Cognition and memory are impaired. She exhibits abnormal recent memory.  Nursing note and vitals reviewed.   Labs reviewed: Recent Labs    11/06/16 1748 11/10/16 0530 04/18/17 0948  NA 132* 135 139  K 4.0 4.6 3.8  CL 103 106 105  CO2 19* 18* 24  GLUCOSE 159* 204* 195*  BUN 34* 25* 13  CREATININE 0.70  0.67 0.57  CALCIUM 10.3 9.5 9.5   Recent Labs    11/10/16 0530 04/18/17 0948  AST 19 25  ALT 23 17  ALKPHOS 60 100  BILITOT 1.0 0.5  PROT 7.3 7.0  ALBUMIN 4.3 3.3*   Recent Labs    11/06/16 1748 11/10/16 0530 04/18/17 0948  WBC 15.6* 10.8 7.7  NEUTROABS  --  7.0* 4.4  HGB 12.9 13.2 11.6*  HCT 37.8 39.4 34.6*  MCV 88.1 89.9 87.8  PLT 317 307 331   Lab Results  Component Value Date   TSH 1.55 10/28/2016   Lab Results  Component Value Date   HGBA1C 6.9 10/28/2016   No results found for: CHOL, HDL, LDLCALC, LDLDIRECT, TRIG, CHOLHDL  Significant Diagnostic Results in last 30 days:  No results found.  Assessment/Plan 1.  Alzheimer's disease with late onset (CODE)  Stable   Continue Exelon patch 9.5 mg TD Q Day  2. Dementia in other diseases classified elsewhere without behavioral disturbance  Stable   3. Benign paroxysmal vertigo of left ear  Stable  4. Diabetes mellitus without complication (HCC)  Stable  Continue Metformin 500 mg po BID  5. Essential (primary) hypertension  Stable   Continue Enalapril 20 mg po BID  6. Hyperlipidemia, unspecified hyperlipidemia type  Stable   Continue Atorvastatin 10 mg po Q Day  7. Hypothyroidism, unspecified type  Stable  Continue Synthroid 50 mcg po Q Day  8. Hypokalemia  Stable   Continue Klor-Con 10 meq- 2 tablets po Q Day  9. Anxiety disorder, unspecified type  Stable   10. Osteoarthritis, unspecified osteoarthritis type, unspecified site  Stable  Continue Tylenol SR 650 mg - 2 tablets po BID   Continue all medications as listed above (05/07/17)  Family/ staff Communication:   Total Time:  Documentation:  Face to Face:  Family/Phone:   Labs/tests ordered:   Medication list reviewed and assessed for continued appropriateness. Monthly medication orders reviewed and signed.  Vikki Ports, NP-C Geriatrics [facility] [phone], [place] [address] Cell Phone (Mon-Fri 8am-5pm):  [phone] On Call:  [phone] & follow prompts after 5pm & weekends Office Phone:  [phone] Office Fax:  [phone]

## 2017-05-26 ENCOUNTER — Encounter
Admission: RE | Admit: 2017-05-26 | Discharge: 2017-05-26 | Disposition: A | Discharge status: Home / Self Care | Payer: Medicare Other | Source: Ambulatory Visit | Attending: Internal Medicine | Admitting: Internal Medicine

## 2017-06-10 ENCOUNTER — Encounter: Payer: Self-pay | Admitting: Gerontology

## 2017-06-10 ENCOUNTER — Non-Acute Institutional Stay: Payer: Medicare Other | Admitting: Gerontology

## 2017-06-10 DIAGNOSIS — E039 Hypothyroidism, unspecified: Secondary | ICD-10-CM | POA: Diagnosis not present

## 2017-06-10 DIAGNOSIS — I10 Essential (primary) hypertension: Secondary | ICD-10-CM

## 2017-06-10 DIAGNOSIS — E119 Type 2 diabetes mellitus without complications: Secondary | ICD-10-CM

## 2017-06-14 NOTE — Progress Notes (Signed)
Location:    Nursing Home Room Number: 035K Place of Service:  ALF [phone]) Provider:  Toni Arthurs, NP-C  [name], MD  Patient Care Team: [name], MD as PCP - General (Internal Medicine)  Extended Emergency Contact Information Primary Emergency Contact: Irwin,Cheryl E Address: [address]          [place], [place] [id number] Johnnette Litter of [place] Phone: [phone] Mobile Phone: [phone] Relation: Daughter Secondary Emergency Contact: Philippa Sicks [place] of [place] Phone: [phone] Mobile Phone: [phone] Relation: Daughter  Code Status: FULL Goals of care: Advanced Directive information Advanced Directives 06/10/2017  Does Patient Have a Medical Advance Directive? No  Would patient like information on creating a medical advance directive? No - Patient declined     Chief Complaint  Patient presents with  . Medical Management of Chronic Issues    Routine Visit    HPI:  Pt is a 84 y.o. female seen today for medical management of chronic diseases.    Essential (primary) hypertension Stable. No recent episodes of hypotension. Treated with Vasotec  Hypothyroidism Stable. Currently on Synthroid 50 mcg po Q Day.   Diabetes mellitus without complication (HCC) Stable. Blood glucose managed by metformin BID. FSBS daily and prn  Please note pt with limited verbal ability/dementia. Unable to obtain complete ROS. Some ROS info obtained from staff and documentation.     Past Medical History:  Diagnosis Date  . Diabetes mellitus ([place])    diet controlled  . Diabetes mellitus without complication ([place])   . Dry eye syndrome   . Hyperlipidemia, unspecified   . Hypertension   . Inflammatory arthritis   . Osteoporosis   . Postmenopausal    Past Surgical History:  Procedure Laterality Date  . ABDOMINAL HYSTERECTOMY    . APPENDECTOMY    . CAPSULOTOMY     OS  . CATARACT EXTRACTION W/PHACO Right 08/2008   crystalens 24.0D  .  CATARACT EXTRACTION W/PHACO Left 01/18/2009   Crystalens 24.5D    Allergies  Allergen Reactions  . Epinephrine Other (See Comments)    Other Reaction: Passed out after dental inject  . Latex Other (See Comments)    Other Reaction: angioedema, mouth burns  . Levofloxacin     Other reaction(s): Hallucination  . Other Other (See Comments)    Uncoded Allergy. Allergen: ENVIRONMENTAL ALLERGIES    Allergies as of 06/10/2017      Reactions   Epinephrine Other (See Comments)   Other Reaction: Passed out after dental inject   Latex Other (See Comments)   Other Reaction: angioedema, mouth burns   Levofloxacin    Other reaction(s): Hallucination   Other Other (See Comments)   Uncoded Allergy. Allergen: ENVIRONMENTAL ALLERGIES      Medication List        Accurate as of 06/10/17 11:59 PM. Always use your most recent med list.          atorvastatin 10 MG tablet Commonly known as:  LIPITOR Take 10 mg by mouth daily.   enalapril 20 MG tablet Commonly known as:  VASOTEC Take 20 mg by mouth 2 (two) times daily. 8 am and 5 pm   ipratropium-albuterol 0.5-2.5 (3) MG/3ML Soln Commonly known as:  DUONEB Take 3 mLs by nebulization every 6 (six) hours as needed.   levothyroxine 50 MCG tablet Commonly known as:  SYNTHROID, LEVOTHROID Take 50 mcg by mouth daily before breakfast. 6 am   metFORMIN 500 MG tablet Commonly known as:  GLUCOPHAGE Take  500 mg by mouth 2 (two) times daily with a meal. 8 am and 5 pm   Phenol 0.5 % Liqd Use as directed 1 spray in the mouth or throat every 2 (two) hours as needed. Cherry flavor throat spray for sore throat until healed   potassium chloride 10 MEQ tablet Commonly known as:  K-DUR,KLOR-CON Take 20 mEq by mouth daily. 2 tabs   rivastigmine 9.5 mg/24hr Commonly known as:  EXELON Place 9.5 mg onto the skin daily. 8 am   TYLENOL ARTHRITIS PAIN 650 MG CR tablet Generic drug:  acetaminophen Take 650 mg by mouth 2 (two) times daily.        Review of Systems  Unable to perform ROS: Dementia  Constitutional: Negative for activity change, appetite change, chills, diaphoresis and fever.  HENT: Negative for congestion, mouth sores, nosebleeds, postnasal drip, sneezing, sore throat, trouble swallowing and voice change.   Respiratory: Negative for apnea, cough, choking, chest tightness, shortness of breath and wheezing.   Cardiovascular: Negative for chest pain, palpitations and leg swelling.  Gastrointestinal: Negative for abdominal distention, abdominal pain, constipation, diarrhea and nausea.  Genitourinary: Negative for difficulty urinating, dysuria, frequency and urgency.  Musculoskeletal: Positive for arthralgias (typical arthritis). Negative for back pain, gait problem and myalgias.  Skin: Negative for color change, pallor, rash and wound.  Neurological: Negative for dizziness, tremors, syncope, speech difficulty, weakness, numbness and headaches.  Psychiatric/Behavioral: Negative for agitation and behavioral problems.  All other systems reviewed and are negative.   Immunization History  Administered Date(s) Administered  . Influenza Inj Mdck Quad Pf 03/26/2016  . Influenza-Unspecified 02/16/2014, 02/17/2017  . Pneumococcal Polysaccharide-23 07/30/2015   Pertinent  Health Maintenance Due  Topic Date Due  . FOOT EXAM  12/29/1939  . OPHTHALMOLOGY EXAM  12/29/1939  . DEXA SCAN  12/29/1996  . PNA vac Low Risk Adult (2 of 2 - PCV13) 07/29/2016  . HEMOGLOBIN A1C  04/29/2017  . INFLUENZA VACCINE  Completed   No flowsheet data found. Functional Status Survey:    Vitals:   06/10/17 1057  BP: (!) 146/66  Pulse: 80  Resp: 16  Temp: 98.8 F (37.1 C)  TempSrc: Oral  SpO2: 98%  Weight: 136 lb 8 oz (61.9 kg)  Height: 4\' 11"  (1.499 m)   Body mass index is 27.57 kg/m. Physical Exam  Constitutional: She is oriented to person, place, and time. Vital signs are normal. She appears well-developed and well-nourished.  She is active and cooperative. She does not appear ill. No distress.  HENT:  Head: Normocephalic and atraumatic.  Mouth/Throat: Uvula is midline, oropharynx is clear and moist and mucous membranes are normal. Mucous membranes are not pale, not dry and not cyanotic.  Eyes: Conjunctivae, EOM and lids are normal. Pupils are equal, round, and reactive to light.  Neck: Trachea normal, normal range of motion and full passive range of motion without pain. Neck supple. No JVD present. No tracheal deviation, no edema and no erythema present. No thyromegaly present.  Cardiovascular: Normal rate, regular rhythm, normal heart sounds, intact distal pulses and normal pulses. Exam reveals no gallop, no distant heart sounds and no friction rub.  No murmur heard. Pulses:      Dorsalis pedis pulses are 2+ on the right side, and 2+ on the left side.  No edema  Pulmonary/Chest: Effort normal and breath sounds normal. No accessory muscle usage. No respiratory distress. She has no decreased breath sounds. She has no wheezes. She has no rhonchi. She has no rales.  She exhibits no tenderness.  Abdominal: Soft. Normal appearance and bowel sounds are normal. She exhibits no distension and no ascites. There is no tenderness.  Musculoskeletal: Normal range of motion. She exhibits no edema or tenderness.  Expected osteoarthritis, stiffness; Bilateral Calves soft, supple. Negative Homan's Sign. B- pedal pulses equal  Neurological: She is alert and oriented to person, place, and time. She has normal strength.  Skin: Skin is warm, dry and intact. She is not diaphoretic. No cyanosis. No pallor. Nails show no clubbing.  Psychiatric: She has a normal mood and affect. Her speech is normal and behavior is normal. Judgment and thought content normal. Cognition and memory are impaired. She exhibits abnormal recent memory and abnormal remote memory.  Nursing note and vitals reviewed.   Labs reviewed: Recent Labs    11/06/16 1748  11/10/16 0530 04/18/17 0948  NA 132* 135 139  K 4.0 4.6 3.8  CL 103 106 105  CO2 19* 18* 24  GLUCOSE 159* 204* 195*  BUN 34* 25* 13  CREATININE 0.70 0.67 0.57  CALCIUM 10.3 9.5 9.5   Recent Labs    11/10/16 0530 04/18/17 0948  AST 19 25  ALT 23 17  ALKPHOS 60 100  BILITOT 1.0 0.5  PROT 7.3 7.0  ALBUMIN 4.3 3.3*   Recent Labs    11/06/16 1748 11/10/16 0530 04/18/17 0948  WBC 15.6* 10.8 7.7  NEUTROABS  --  7.0* 4.4  HGB 12.9 13.2 11.6*  HCT 37.8 39.4 34.6*  MCV 88.1 89.9 87.8  PLT 317 307 331   Lab Results  Component Value Date   TSH 1.55 10/28/2016   Lab Results  Component Value Date   HGBA1C 6.9 10/28/2016   No results found for: CHOL, HDL, LDLCALC, LDLDIRECT, TRIG, CHOLHDL  Significant Diagnostic Results in last 30 days:  No results found.  Assessment/Plan Tayen was seen today for medical management of chronic issues.  Diagnoses and all orders for this visit:  Essential (primary) hypertension  Hypothyroidism, unspecified type  Diabetes mellitus without complication ([place])   above conditions stable  Continue current medication regimen  Monitor for hypoglycemia  Monitor for hypotension  Continue FSBS Q Day and prn  Family/ staff Communication:   Total Time:  Documentation:  Face to Face:  Family/Phone:   Labs/tests ordered:    Medication list reviewed and assessed for continued appropriateness. Monthly medication orders reviewed and signed.  Vikki Ports, NP-C Geriatrics [facility] [phone],  [id number] Cell Phone (Mon-Fri 8am-5pm):  [phone] On Call:  [phone] & follow prompts after 5pm & weekends Office Phone:  [phone] Office Fax:  [phone]

## 2017-06-14 NOTE — Assessment & Plan Note (Signed)
Stable. Blood glucose managed by metformin BID. FSBS daily and prn

## 2017-06-14 NOTE — Assessment & Plan Note (Signed)
Stable. No recent episodes of hypotension. Treated with Vasotec

## 2017-06-14 NOTE — Assessment & Plan Note (Signed)
Stable. Currently on Synthroid 50 mcg po Q Day.

## 2017-06-26 ENCOUNTER — Encounter
Admission: RE | Admit: 2017-06-26 | Discharge: 2017-06-26 | Disposition: A | Discharge status: Home / Self Care | Payer: Medicare Other | Source: Ambulatory Visit | Attending: Internal Medicine | Admitting: Internal Medicine

## 2017-07-09 ENCOUNTER — Non-Acute Institutional Stay: Payer: Medicare Other | Admitting: Gerontology

## 2017-07-09 ENCOUNTER — Encounter: Payer: Self-pay | Admitting: Gerontology

## 2017-07-09 DIAGNOSIS — G301 Alzheimer's disease with late onset: Secondary | ICD-10-CM

## 2017-07-09 DIAGNOSIS — H8112 Benign paroxysmal vertigo, left ear: Secondary | ICD-10-CM

## 2017-07-09 DIAGNOSIS — F0280 Dementia in other diseases classified elsewhere without behavioral disturbance: Secondary | ICD-10-CM | POA: Diagnosis not present

## 2017-07-24 ENCOUNTER — Encounter
Admission: RE | Admit: 2017-07-24 | Discharge: 2017-07-24 | Disposition: A | Discharge status: Home / Self Care | Payer: Medicare Other | Source: Ambulatory Visit | Attending: Internal Medicine | Admitting: Internal Medicine

## 2017-08-13 ENCOUNTER — Encounter: Payer: Self-pay | Admitting: Gerontology

## 2017-08-13 ENCOUNTER — Non-Acute Institutional Stay: Payer: Medicare Other | Admitting: Gerontology

## 2017-08-13 DIAGNOSIS — M1990 Unspecified osteoarthritis, unspecified site: Secondary | ICD-10-CM | POA: Diagnosis not present

## 2017-08-13 DIAGNOSIS — E785 Hyperlipidemia, unspecified: Secondary | ICD-10-CM | POA: Diagnosis not present

## 2017-08-13 DIAGNOSIS — E876 Hypokalemia: Secondary | ICD-10-CM | POA: Diagnosis not present

## 2017-08-17 NOTE — Assessment & Plan Note (Signed)
Stable. Currently resident on [place] unit. Thriving well. On Exelon 9.5 patch

## 2017-08-17 NOTE — Assessment & Plan Note (Signed)
Stable. Pt is a resident on [place] [facility]. Thriving well. On Exelon patch 9.5 mg

## 2017-08-17 NOTE — Assessment & Plan Note (Signed)
Stable. On Lipitor 10 mg po Q Day. Will recheck lipids next months with routine labs

## 2017-08-17 NOTE — Assessment & Plan Note (Signed)
Stable. Symptoms controlled on Potassium chloride 10 meq- 2 tablets po Q Day. Will recheck labs next month when due.

## 2017-08-17 NOTE — Progress Notes (Signed)
Location:    Nursing Home Room Number: 462V Place of Service:  ALF [phone]) Provider:  Toni Arthurs, NP-C  [name], MD  Patient Care Team: [name], MD as PCP - General (Internal Medicine)  Extended Emergency Contact Information Primary Emergency Contact: Irwin,Cheryl E Address: [address]          [place], [place] [id number] Johnnette Litter of [place] Phone: [phone] Mobile Phone: [phone] Relation: Daughter Secondary Emergency Contact: Philippa Sicks [place] of [place] Phone: [phone] Mobile Phone: [phone] Relation: Daughter  Code Status:  full Goals of care: Advanced Directive information Advanced Directives 08/13/2017  Does Patient Have a Medical Advance Directive? No  Would patient like information on creating a medical advance directive? -     Chief Complaint  Patient presents with  . Medical Management of Chronic Issues    Routine Visit    HPI:  Katie Logan is a 84 y.o. female seen today for medical management of chronic diseases.    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 Katie Logan is a resident on [place] [facility]. Thriving well. On Exelon patch 9.5 mg  Benign paroxysmal vertigo of left ear Stable. No recent exacerbations. Gait steady. Denies dizziness or vertigo  Alzheimer's disease with late onset (CODE) Stable. Currently resident on [place] unit. Thriving well. On Exelon 9.5 patch  Please note Katie Logan with limited verbal/cognitive ability. Unable to obtain complete ROS. Some ROS info obtained from staff and documentation.   Past Medical History:  Diagnosis Date  . Diabetes mellitus ([place])    diet controlled  . Diabetes mellitus without complication ([place])   . Dry eye syndrome   . Hyperlipidemia, unspecified   . Hypertension   . Inflammatory arthritis   . Osteoporosis   . Postmenopausal    Past Surgical History:  Procedure Laterality Date  . ABDOMINAL  HYSTERECTOMY    . APPENDECTOMY    . CAPSULOTOMY     OS  . CATARACT EXTRACTION W/PHACO Right 08/2008   crystalens 24.0D  . CATARACT EXTRACTION W/PHACO Left 01/18/2009   Crystalens 24.5D    Allergies  Allergen Reactions  . Epinephrine Other (See Comments)    Other Reaction: Passed out after dental inject  . Latex Other (See Comments)    Other Reaction: angioedema, mouth burns  . Levofloxacin     Other reaction(s): Hallucination  . Other Other (See Comments)    Uncoded Allergy. Allergen: ENVIRONMENTAL ALLERGIES    Allergies as of 07/09/2017      Reactions   Epinephrine Other (See Comments)   Other Reaction: Passed out after dental inject   Latex Other (See Comments)   Other Reaction: angioedema, mouth burns   Levofloxacin    Other reaction(s): Hallucination   Other Other (See Comments)   Uncoded Allergy. Allergen: ENVIRONMENTAL ALLERGIES      Medication List        Accurate as of 07/09/17 11:59 PM. Always use your most recent med list.          atorvastatin 10 MG tablet Commonly known as:  LIPITOR Take 10 mg by mouth daily.   enalapril 20 MG tablet Commonly known as:  VASOTEC Take 20 mg by mouth 2 (two) times daily. 8 am and 5 pm   ipratropium-albuterol 0.5-2.5 (3) MG/3ML Soln Commonly known as:  DUONEB Take 3 mLs by nebulization every 6 (six) hours as needed.   levothyroxine 50 MCG tablet Commonly known as:  SYNTHROID, LEVOTHROID Take 50  mcg by mouth daily before breakfast. 6 am   metFORMIN 500 MG tablet Commonly known as:  GLUCOPHAGE Take 500 mg by mouth 2 (two) times daily with a meal. 8 am and 5 pm   Phenol 0.5 % Liqd Use as directed 1 spray in the mouth or throat every 2 (two) hours as needed. Cherry flavor throat spray for sore throat until healed   potassium chloride 10 MEQ tablet Commonly known as:  K-DUR,KLOR-CON Take 20 mEq by mouth daily. 2 tabs   rivastigmine 9.5 mg/24hr Commonly known as:  EXELON Place 9.5 mg onto the skin daily. 8 am   Rotate sites.   TYLENOL ARTHRITIS PAIN 650 MG CR tablet Generic drug:  acetaminophen Take 650 mg by mouth 2 (two) times daily.       Review of Systems  Unable to perform ROS: Dementia  Constitutional: Negative for activity change, appetite change, chills, diaphoresis and fever.  HENT: Negative for congestion, mouth sores, nosebleeds, postnasal drip, sneezing, sore throat, trouble swallowing and voice change.   Respiratory: Negative for apnea, cough, choking, chest tightness, shortness of breath and wheezing.   Cardiovascular: Negative for chest pain, palpitations and leg swelling.  Gastrointestinal: Negative for abdominal distention, abdominal pain, constipation, diarrhea and nausea.  Genitourinary: Negative for difficulty urinating, dysuria, frequency and urgency.  Musculoskeletal: Negative for back pain, gait problem and myalgias. Arthralgias: typical arthritis.  Skin: Negative for color change, pallor, rash and wound.  Neurological: Negative for dizziness, tremors, syncope, speech difficulty, weakness, numbness and headaches.  Psychiatric/Behavioral: Negative for agitation and behavioral problems.  All other systems reviewed and are negative.   Immunization History  Administered Date(s) Administered  . Influenza Inj Mdck Quad Pf 03/26/2016  . Influenza-Unspecified 02/16/2014, 02/17/2017  . Pneumococcal Polysaccharide-23 07/30/2015   Pertinent  Health Maintenance Due  Topic Date Due  . FOOT EXAM  12/29/1939  . OPHTHALMOLOGY EXAM  12/29/1939  . DEXA SCAN  12/29/1996  . PNA vac Low Risk Adult (2 of 2 - PCV13) 07/29/2016  . HEMOGLOBIN A1C  04/29/2017  . INFLUENZA VACCINE  Completed   No flowsheet data found. Functional Status Survey:    Vitals:   07/09/17 1409  BP: (!) 157/68  Pulse: 89  Resp: 16  Temp: 98.6 F (37 C)  TempSrc: Oral  SpO2: 98%  Weight: 136 lb (61.7 kg)  Height: 4\' 11"  (1.499 m)   Body mass index is 27.47 kg/m. Physical Exam  Constitutional:  Katie Logan is oriented to person, place, and time. Vital signs are normal. Katie Logan appears well-developed and well-nourished. Katie Logan is active and cooperative. Katie Logan does not appear ill. No distress.  HENT:  Head: Normocephalic and atraumatic.  Mouth/Throat: Uvula is midline, oropharynx is clear and moist and mucous membranes are normal. Mucous membranes are not pale, not dry and not cyanotic.  Eyes: Pupils are equal, round, and reactive to light. Conjunctivae, EOM and lids are normal.  Neck: Trachea normal, normal range of motion and full passive range of motion without pain. Neck supple. No JVD present. No tracheal deviation, no edema and no erythema present. No thyromegaly present.  Cardiovascular: Normal rate, regular rhythm, normal heart sounds, intact distal pulses and normal pulses. Exam reveals no gallop, no distant heart sounds and no friction rub.  No murmur heard. Pulses:      Dorsalis pedis pulses are 2+ on the right side, and 2+ on the left side.  No edema  Pulmonary/Chest: Effort normal and breath sounds normal. No accessory muscle usage. No respiratory  distress. Katie Logan has no decreased breath sounds. Katie Logan has no wheezes. Katie Logan has no rhonchi. Katie Logan has no rales. Katie Logan exhibits no tenderness.  Abdominal: Soft. Normal appearance and bowel sounds are normal. Katie Logan exhibits no distension and no ascites. There is no tenderness.  Musculoskeletal: Normal range of motion. Katie Logan exhibits no edema or tenderness.  Expected osteoarthritis, stiffness; Bilateral Calves soft, supple. Negative Homan's Sign. B- pedal pulses equal  Neurological: Katie Logan is alert and oriented to person, place, and time. Katie Logan has normal strength. Katie Logan exhibits abnormal muscle tone.  Skin: Skin is warm, dry and intact. Katie Logan is not diaphoretic. No cyanosis. No pallor. Nails show no clubbing.  Psychiatric: Katie Logan has a normal mood and affect. Her speech is normal and behavior is normal. Judgment and thought content normal. Cognition and memory are impaired. Katie Logan  exhibits abnormal recent memory.  Nursing note and vitals reviewed.   Labs reviewed: Recent Labs    11/06/16 1748 11/10/16 0530 04/18/17 0948  NA 132* 135 139  K 4.0 4.6 3.8  CL 103 106 105  CO2 19* 18* 24  GLUCOSE 159* 204* 195*  BUN 34* 25* 13  CREATININE 0.70 0.67 0.57  CALCIUM 10.3 9.5 9.5   Recent Labs    11/10/16 0530 04/18/17 0948  AST 19 25  ALT 23 17  ALKPHOS 60 100  BILITOT 1.0 0.5  PROT 7.3 7.0  ALBUMIN 4.3 3.3*   Recent Labs    11/06/16 1748 11/10/16 0530 04/18/17 0948  WBC 15.6* 10.8 7.7  NEUTROABS  --  7.0* 4.4  HGB 12.9 13.2 11.6*  HCT 37.8 39.4 34.6*  MCV 88.1 89.9 87.8  PLT 317 307 331   Lab Results  Component Value Date   TSH 1.55 10/28/2016   Lab Results  Component Value Date   HGBA1C 6.9 10/28/2016   No results found for: CHOL, HDL, LDLCALC, LDLDIRECT, TRIG, CHOLHDL  Significant Diagnostic Results in last 30 days:  No results found.  Assessment/Plan Katie Logan was seen today for medical management of chronic issues.  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 Monthly medication orders reviewed and signed.  Vikki Ports, NP-C Geriatrics [facility] [phone], [place] [address] Cell Phone (Mon-Fri 8am-5pm):  [phone] On Call:  [phone] & follow prompts after 5pm  & weekends Office Phone:  [phone] Office Fax:  [phone]

## 2017-08-17 NOTE — Progress Notes (Signed)
Location:    Nursing Home Room Number: 161W Place of Service:  ALF [phone]) Provider:  Toni Arthurs, NP-C  [name], MD  Patient Care Team: [name], MD as PCP - General (Internal Medicine)  Extended Emergency Contact Information Primary Emergency Contact: Irwin,Cheryl E Address: [address]          [place], [place] [id number] Johnnette Litter of [place] Phone: [phone] Mobile Phone: [phone] Relation: Daughter Secondary Emergency Contact: Philippa Sicks [place] of [place] Phone: [phone] Mobile Phone: [phone] Relation: Daughter  Code Status:  full Goals of care: Advanced Directive information Advanced Directives 08/13/2017  Does Patient Have a Medical Advance Directive? No  Would patient like information on creating a medical advance directive? -     Chief Complaint  Patient presents with  . Medical Management of Chronic Issues    Routine Visit    HPI:  Pt is a 84 y.o. female seen today for medical management of chronic diseases.    Osteoarthritis Stable. No complaints of pain lately. Symptoms controlled on APAP Arthritis CR 650 mg po BID  Hypokalemia Stable. Symptoms controlled on Potassium chloride 10 meq- 2 tablets po Q Day. Will recheck labs next month when due.  Hyperlipidemia Stable. On Lipitor 10 mg po Q Day. Will recheck lipids next months with routine labs  Please note pt with limited verbal/cognitive ability. Unable to obtain complete ROS. Some ROS info obtained from staff and documentation.    Past Medical History:  Diagnosis Date  . Diabetes mellitus ([place])    diet controlled  . Diabetes mellitus without complication ([place])   . Dry eye syndrome   . Hyperlipidemia, unspecified   . Hypertension   . Inflammatory arthritis   . Osteoporosis   . Postmenopausal    Past Surgical History:  Procedure Laterality Date  . ABDOMINAL HYSTERECTOMY    . APPENDECTOMY    . CAPSULOTOMY     OS  . CATARACT EXTRACTION  W/PHACO Right 08/2008   crystalens 24.0D  . CATARACT EXTRACTION W/PHACO Left 01/18/2009   Crystalens 24.5D    Allergies  Allergen Reactions  . Epinephrine Other (See Comments)    Other Reaction: Passed out after dental inject  . Latex Other (See Comments)    Other Reaction: angioedema, mouth burns  . Levofloxacin     Other reaction(s): Hallucination  . Other Other (See Comments)    Uncoded Allergy. Allergen: ENVIRONMENTAL ALLERGIES    Allergies as of 08/13/2017      Reactions   Epinephrine Other (See Comments)   Other Reaction: Passed out after dental inject   Latex Other (See Comments)   Other Reaction: angioedema, mouth burns   Levofloxacin    Other reaction(s): Hallucination   Other Other (See Comments)   Uncoded Allergy. Allergen: ENVIRONMENTAL ALLERGIES      Medication List        Accurate as of 08/13/17 11:59 PM. Always use your most recent med list.          atorvastatin 10 MG tablet Commonly known as:  LIPITOR Take 10 mg by mouth daily.   dexamethasone 0.5 MG/5ML elixir Take 0.5 mg by mouth 4 (four) times daily. (1 tbsp) Rinse for 1 minute.per Melina Copa Byerly   enalapril 20 MG tablet Commonly known as:  VASOTEC Take 20 mg by mouth 2 (two) times daily. 8 am and 5 pm   ipratropium-albuterol 0.5-2.5 (3) MG/3ML Soln Commonly known as:  DUONEB Take 3 mLs by nebulization every 6 (six)  hours as needed.   levothyroxine 50 MCG tablet Commonly known as:  SYNTHROID, LEVOTHROID Take 50 mcg by mouth daily before breakfast. 6 am   metFORMIN 500 MG tablet Commonly known as:  GLUCOPHAGE Take 500 mg by mouth 2 (two) times daily with a meal. 8 am and 5 pm   Phenol 0.5 % Liqd Use as directed 1 spray in the mouth or throat every 2 (two) hours as needed. Cherry flavor throat spray for sore throat until healed   potassium chloride 10 MEQ tablet Commonly known as:  K-DUR,KLOR-CON Take 20 mEq by mouth daily. 2 tabs   rivastigmine 9.5 mg/24hr Commonly known as:   EXELON Place 9.5 mg onto the skin daily. 8 am  Rotate sites.   TYLENOL ARTHRITIS PAIN 650 MG CR tablet Generic drug:  acetaminophen Take 650 mg by mouth 2 (two) times daily.       Review of Systems  Unable to perform ROS: Dementia  Constitutional: Negative for activity change, appetite change, chills, diaphoresis and fever.  HENT: Negative for congestion, mouth sores, nosebleeds, postnasal drip, sneezing, sore throat, trouble swallowing and voice change.   Respiratory: Negative for apnea, cough, choking, chest tightness, shortness of breath and wheezing.   Cardiovascular: Negative for chest pain, palpitations and leg swelling.  Gastrointestinal: Negative for abdominal distention, abdominal pain, constipation, diarrhea and nausea.  Genitourinary: Negative for difficulty urinating, dysuria, frequency and urgency.  Musculoskeletal: Positive for arthralgias (typical arthritis). Negative for back pain, gait problem and myalgias.  Skin: Negative for color change, pallor, rash and wound.  Neurological: Negative for dizziness, tremors, syncope, speech difficulty, weakness, numbness and headaches.  Psychiatric/Behavioral: Negative for agitation and behavioral problems.  All other systems reviewed and are negative.   Immunization History  Administered Date(s) Administered  . Influenza Inj Mdck Quad Pf 03/26/2016  . Influenza-Unspecified 02/16/2014, 02/17/2017  . Pneumococcal Polysaccharide-23 07/30/2015   Pertinent  Health Maintenance Due  Topic Date Due  . FOOT EXAM  12/29/1939  . OPHTHALMOLOGY EXAM  12/29/1939  . DEXA SCAN  12/29/1996  . PNA vac Low Risk Adult (2 of 2 - PCV13) 07/29/2016  . HEMOGLOBIN A1C  04/29/2017  . INFLUENZA VACCINE  Completed   No flowsheet data found. Functional Status Survey:    Vitals:   08/13/17 1317  BP: (!) 146/66  Pulse: 80  Resp: 16  Temp: (!) 96.6 F (35.9 C)  TempSrc: Oral  SpO2: 98%  Weight: 135 lb (61.2 kg)  Height: 4\' 11"  (1.499 m)    Body mass index is 27.27 kg/m. Physical Exam  Constitutional: She is oriented to person, place, and time. Vital signs are normal. She appears well-developed and well-nourished. She is active and cooperative. She does not appear ill. No distress.  HENT:  Head: Normocephalic and atraumatic.  Mouth/Throat: Uvula is midline, oropharynx is clear and moist and mucous membranes are normal. Mucous membranes are not pale, not dry and not cyanotic.  Eyes: Pupils are equal, round, and reactive to light. Conjunctivae, EOM and lids are normal.  Neck: Trachea normal, normal range of motion and full passive range of motion without pain. Neck supple. No JVD present. No tracheal deviation, no edema and no erythema present. No thyromegaly present.  Cardiovascular: Normal rate, regular rhythm, normal heart sounds, intact distal pulses and normal pulses. Exam reveals no gallop, no distant heart sounds and no friction rub.  No murmur heard. Pulses:      Dorsalis pedis pulses are 2+ on the right side, and 2+ on  the left side.  No edema  Pulmonary/Chest: Effort normal and breath sounds normal. No accessory muscle usage. No respiratory distress. She has no decreased breath sounds. She has no wheezes. She has no rhonchi. She has no rales. She exhibits no tenderness.  Abdominal: Soft. Normal appearance and bowel sounds are normal. She exhibits no distension and no ascites. There is no tenderness.  Musculoskeletal: Normal range of motion. She exhibits no edema or tenderness.  Expected osteoarthritis, stiffness; Bilateral Calves soft, supple. Negative Homan's Sign. B- pedal pulses equal  Neurological: She is alert and oriented to person, place, and time. She has normal strength. Coordination abnormal.  Skin: Skin is warm, dry and intact. She is not diaphoretic. No cyanosis. No pallor. Nails show no clubbing.  Psychiatric: She has a normal mood and affect. Her speech is normal and behavior is normal. Thought content  normal. Cognition and memory are impaired. She exhibits abnormal recent memory and abnormal remote memory.  Nursing note and vitals reviewed.   Labs reviewed: Recent Labs    11/06/16 1748 11/10/16 0530 04/18/17 0948  NA 132* 135 139  K 4.0 4.6 3.8  CL 103 106 105  CO2 19* 18* 24  GLUCOSE 159* 204* 195*  BUN 34* 25* 13  CREATININE 0.70 0.67 0.57  CALCIUM 10.3 9.5 9.5   Recent Labs    11/10/16 0530 04/18/17 0948  AST 19 25  ALT 23 17  ALKPHOS 60 100  BILITOT 1.0 0.5  PROT 7.3 7.0  ALBUMIN 4.3 3.3*   Recent Labs    11/06/16 1748 11/10/16 0530 04/18/17 0948  WBC 15.6* 10.8 7.7  NEUTROABS  --  7.0* 4.4  HGB 12.9 13.2 11.6*  HCT 37.8 39.4 34.6*  MCV 88.1 89.9 87.8  PLT 317 307 331   Lab Results  Component Value Date   TSH 1.55 10/28/2016   Lab Results  Component Value Date   HGBA1C 6.9 10/28/2016   No results found for: CHOL, HDL, LDLCALC, LDLDIRECT, TRIG, CHOLHDL  Significant Diagnostic Results in last 30 days:  No results found.  Assessment/Plan Katie Logan was seen today for medical management of chronic issues.  Diagnoses and all orders for this visit:  Osteoarthritis, unspecified osteoarthritis type, unspecified site  Hypokalemia  Hyperlipidemia, unspecified hyperlipidemia type   above listed conditions stable  Continue current medication regimen  Monitor for muscle weakness or myalgias  Monitor for worsening pains associated with OA  Encourage pt to stay active  Family/ staff Communication:   Total Time:  Documentation:  Face to Face:  Family/Phone:   Labs/tests ordered:  Not due  Medication list reviewed and assessed for continued appropriateness. Monthly medication orders reviewed and signed.  Vikki Ports, NP-C [facility] [phone], [place] [address] Cell Phone (Mon-Fri 8am-5pm):  [phone] On Call:  [phone] & follow prompts after 5pm & weekends Office  Phone:  [phone] Office Fax:  [phone]

## 2017-08-17 NOTE — Assessment & Plan Note (Signed)
Stable. No complaints of pain lately. Symptoms controlled on APAP Arthritis CR 650 mg po BID

## 2017-08-17 NOTE — Assessment & Plan Note (Signed)
Stable. No recent exacerbations. Gait steady. Denies dizziness or vertigo

## 2017-08-24 ENCOUNTER — Encounter
Admission: RE | Admit: 2017-08-24 | Discharge: 2017-08-24 | Disposition: A | Discharge status: Home / Self Care | Payer: Medicare Other | Source: Ambulatory Visit | Attending: Internal Medicine | Admitting: Internal Medicine

## 2017-09-10 ENCOUNTER — Non-Acute Institutional Stay: Payer: Medicare Other | Admitting: Gerontology

## 2017-09-10 ENCOUNTER — Encounter: Payer: Self-pay | Admitting: Gerontology

## 2017-09-10 DIAGNOSIS — F419 Anxiety disorder, unspecified: Secondary | ICD-10-CM | POA: Diagnosis not present

## 2017-09-10 DIAGNOSIS — E039 Hypothyroidism, unspecified: Secondary | ICD-10-CM | POA: Diagnosis not present

## 2017-09-10 DIAGNOSIS — I10 Essential (primary) hypertension: Secondary | ICD-10-CM | POA: Diagnosis not present

## 2017-09-10 NOTE — Assessment & Plan Note (Signed)
Stable. Pt not currently on daily antianxiety medications. No recent exacerbations.

## 2017-09-10 NOTE — Progress Notes (Signed)
Location:    Nursing Home Room Number: [place] of Service:  ALF [phone]) Provider:  Toni Arthurs, NP-C  [name], MD  Patient Care Team: [name], MD as PCP - General (Internal Medicine)  Extended Emergency Contact Information Primary Emergency Contact: Irwin,Cheryl E Address: [address]          [place], [place] [id number] [name] Phone: [phone] Mobile Phone: [phone] Relation: Daughter Secondary Emergency Contact: Philippa Sicks [place] of [place] Phone: [phone] Mobile Phone: [phone] Relation: Daughter  Code Status:  DNR Goals of care: Advanced Directive information Advanced Directives 09/10/2017  Does Patient Have a Medical Advance Directive? No  Would patient like information on creating a medical advance directive? -     Chief Complaint  Patient presents with  . Medical Management of Chronic Issues    Routine Visit    HPI:  Pt is a 84 y.o. female seen today for medical management of chronic diseases.    Anxiety disorder Stable. Pt not currently on daily antianxiety medications. No recent exacerbations.   Essential (primary) hypertension Stable. No recent episodes of hyper or hypotension. On vasotec 20 mg po BID. No c/o chest pain or shortness of breath  Hypothyroidism Stable. On Levothyroxine 50 mcg po Q day. Last level 1.55. Will recheck TSH next month with routine labs.    Past Medical History:  Diagnosis Date  . Diabetes mellitus ([place])    diet controlled  . Diabetes mellitus without complication ([place])   . Dry eye syndrome   . Hyperlipidemia, unspecified   . Hypertension   . Inflammatory arthritis   . Osteoporosis   . Postmenopausal    Past Surgical History:  Procedure Laterality Date  . ABDOMINAL HYSTERECTOMY    . APPENDECTOMY    . CAPSULOTOMY     OS  . CATARACT EXTRACTION W/PHACO Right 08/2008   crystalens 24.0D  . CATARACT EXTRACTION W/PHACO Left 01/18/2009   Crystalens 24.5D     Allergies  Allergen Reactions  . Epinephrine Other (See Comments)    Other Reaction: Passed out after dental inject  . Latex Other (See Comments)    Other Reaction: angioedema, mouth burns  . Levofloxacin     Other reaction(s): Hallucination  . Other Other (See Comments)    Uncoded Allergy. Allergen: ENVIRONMENTAL ALLERGIES    Allergies as of 09/10/2017      Reactions   Epinephrine Other (See Comments)   Other Reaction: Passed out after dental inject   Latex Other (See Comments)   Other Reaction: angioedema, mouth burns   Levofloxacin    Other reaction(s): Hallucination   Other Other (See Comments)   Uncoded Allergy. Allergen: ENVIRONMENTAL ALLERGIES      Medication List        Accurate as of 09/10/17  4:55 PM. Always use your most recent med list.          atorvastatin 10 MG tablet Commonly known as:  LIPITOR Take 10 mg by mouth daily.   dexamethasone 0.5 MG/5ML elixir Take 0.5 mg by mouth 4 (four) times daily. (1 tbsp) Rinse for 1 minute.per Melina Copa Byerly   enalapril 20 MG tablet Commonly known as:  VASOTEC Take 20 mg by mouth 2 (two) times daily. 8 am and 5 pm   levothyroxine 50 MCG tablet Commonly known as:  SYNTHROID, LEVOTHROID Take 50 mcg by mouth daily before breakfast. 6 am   metFORMIN 500 MG tablet Commonly known as:  GLUCOPHAGE Take 500 mg  by mouth 2 (two) times daily with a meal. 8 am and 5 pm   Phenol 0.5 % Liqd Use as directed 1 spray in the mouth or throat every 2 (two) hours as needed. Cherry flavor throat spray for sore throat until healed   potassium chloride 10 MEQ tablet Commonly known as:  K-DUR,KLOR-CON Take 20 mEq by mouth daily. 2 tabs   rivastigmine 9.5 mg/24hr Commonly known as:  EXELON Place 9.5 mg onto the skin daily. 8 am  Rotate sites.   TYLENOL ARTHRITIS PAIN 650 MG CR tablet Generic drug:  acetaminophen Take 650 mg by mouth 2 (two) times daily.       Review of Systems  Constitutional: Negative for activity  change, appetite change, chills, diaphoresis and fever.  HENT: Negative for congestion, mouth sores, nosebleeds, postnasal drip, sneezing, sore throat, trouble swallowing and voice change.   Respiratory: Negative for apnea, cough, choking, chest tightness, shortness of breath and wheezing.   Cardiovascular: Negative for chest pain, palpitations and leg swelling.  Gastrointestinal: Negative for abdominal distention, abdominal pain, constipation, diarrhea and nausea.  Genitourinary: Negative for difficulty urinating, dysuria, frequency and urgency.  Musculoskeletal: Positive for arthralgias (typical arthritis). Negative for back pain, gait problem and myalgias.  Skin: Negative for color change, pallor, rash and wound.  Neurological: Negative for dizziness, tremors, syncope, speech difficulty, weakness, numbness and headaches.  Psychiatric/Behavioral: Negative for agitation and behavioral problems.  All other systems reviewed and are negative.   Immunization History  Administered Date(s) Administered  . Influenza Inj Mdck Quad Pf 03/26/2016  . Influenza-Unspecified 02/16/2014, 02/17/2017  . Pneumococcal Polysaccharide-23 07/30/2015   Pertinent  Health Maintenance Due  Topic Date Due  . FOOT EXAM  12/29/1939  . OPHTHALMOLOGY EXAM  12/29/1939  . DEXA SCAN  12/29/1996  . PNA vac Low Risk Adult (2 of 2 - PCV13) 07/29/2016  . HEMOGLOBIN A1C  04/29/2017  . INFLUENZA VACCINE  12/24/2017   No flowsheet data found. Functional Status Survey:    Vitals:   09/10/17 1218  BP: (!) 156/66  Pulse: 68  Resp: 18  Temp: (!) 97.3 F (36.3 C)  TempSrc: Oral  SpO2: 98%  Weight: 136 lb 6.4 oz (61.9 kg)  Height: 4\' 11"  (1.499 m)   Body mass index is 27.55 kg/m. Physical Exam  Constitutional: She is oriented to person, place, and time. Vital signs are normal. She appears well-developed and well-nourished. She is active and cooperative. She does not appear ill. No distress.  HENT:  Head:  Normocephalic and atraumatic.  Mouth/Throat: Uvula is midline, oropharynx is clear and moist and mucous membranes are normal. Mucous membranes are not pale, not dry and not cyanotic.  Eyes: Pupils are equal, round, and reactive to light. Conjunctivae, EOM and lids are normal.  Neck: Trachea normal, normal range of motion and full passive range of motion without pain. Neck supple. No JVD present. No tracheal deviation, no edema and no erythema present. No thyromegaly present.  Cardiovascular: Normal rate, regular rhythm, normal heart sounds, intact distal pulses and normal pulses. Exam reveals no gallop, no distant heart sounds and no friction rub.  No murmur heard. Pulses:      Dorsalis pedis pulses are 2+ on the right side, and 2+ on the left side.  No edema  Pulmonary/Chest: Effort normal and breath sounds normal. No accessory muscle usage. No respiratory distress. She has no decreased breath sounds. She has no wheezes. She has no rhonchi. She has no rales. She exhibits no tenderness.  Abdominal: Soft. Normal appearance and bowel sounds are normal. She exhibits no distension and no ascites. There is no tenderness.  Musculoskeletal: Normal range of motion. She exhibits no edema or tenderness.  Expected osteoarthritis, stiffness; Bilateral Calves soft, supple. Negative Homan's Sign. B- pedal pulses equal  Neurological: She is alert and oriented to person, place, and time. She has normal strength.  Skin: Skin is warm, dry and intact. She is not diaphoretic. No cyanosis. No pallor. Nails show no clubbing.  Psychiatric: She has a normal mood and affect. Her speech is normal and behavior is normal. Judgment and thought content normal. Cognition and memory are impaired. She exhibits abnormal recent memory.  Nursing note and vitals reviewed.   Labs reviewed: Recent Labs    11/06/16 1748 11/10/16 0530 04/18/17 0948  NA 132* 135 139  K 4.0 4.6 3.8  CL 103 106 105  CO2 19* 18* 24  GLUCOSE 159*  204* 195*  BUN 34* 25* 13  CREATININE 0.70 0.67 0.57  CALCIUM 10.3 9.5 9.5   Recent Labs    11/10/16 0530 04/18/17 0948  AST 19 25  ALT 23 17  ALKPHOS 60 100  BILITOT 1.0 0.5  PROT 7.3 7.0  ALBUMIN 4.3 3.3*   Recent Labs    11/06/16 1748 11/10/16 0530 04/18/17 0948  WBC 15.6* 10.8 7.7  NEUTROABS  --  7.0* 4.4  HGB 12.9 13.2 11.6*  HCT 37.8 39.4 34.6*  MCV 88.1 89.9 87.8  PLT 317 307 331   Lab Results  Component Value Date   TSH 1.55 10/28/2016   Lab Results  Component Value Date   HGBA1C 6.9 10/28/2016   No results found for: CHOL, HDL, LDLCALC, LDLDIRECT, TRIG, CHOLHDL  Significant Diagnostic Results in last 30 days:  No results found.  Assessment/Plan Katie Logan was seen today for medical management of chronic issues.  Diagnoses and all orders for this visit:  Anxiety disorder, unspecified type  Essential (primary) hypertension  Hypothyroidism, unspecified type   Above listed conditions stable  Continue current medication regimen  Continue to encourage interaction with other residents and participation in activities  Safety precautions  Fall precautions  Family/ staff Communication:   Total Time:  Documentation:  Face to Face:  Family/Phone:   Labs/tests ordered:  Not due until next month  Medication list reviewed and assessed for continued appropriateness. Monthly medication orders reviewed and signed.  Vikki Ports, NP-C [facility] [phone], [place] [address] Cell Phone (Mon-Fri 8am-5pm):  [phone] On Call:  [phone] & follow prompts after 5pm & weekends Office Phone:  [phone] Office Fax:  [phone]

## 2017-09-10 NOTE — Assessment & Plan Note (Signed)
Stable. On Levothyroxine 50 mcg po Q day. Last level 1.55. Will recheck TSH next month with routine labs.

## 2017-09-10 NOTE — Assessment & Plan Note (Signed)
Stable. No recent episodes of hyper or hypotension. On vasotec 20 mg po BID. No c/o chest pain or shortness of breath

## 2017-09-23 ENCOUNTER — Encounter
Admission: RE | Admit: 2017-09-23 | Discharge: 2017-09-23 | Disposition: A | Discharge status: Home / Self Care | Payer: Medicare Other | Source: Ambulatory Visit | Attending: Internal Medicine | Admitting: Internal Medicine

## 2017-10-04 ENCOUNTER — Other Ambulatory Visit
Admission: RE | Admit: 2017-10-04 | Discharge: 2017-10-04 | Disposition: A | Discharge status: Home / Self Care | Payer: Medicare Other | Source: Skilled Nursing Facility | Attending: Gerontology | Admitting: Gerontology

## 2017-10-04 DIAGNOSIS — R410 Disorientation, unspecified: Secondary | ICD-10-CM | POA: Diagnosis present

## 2017-10-04 LAB — URINALYSIS, COMPLETE (UACMP) WITH MICROSCOPIC
BACTERIA UA: NONE SEEN
Bilirubin Urine: NEGATIVE
Glucose, UA: NEGATIVE mg/dL
KETONES UR: NEGATIVE mg/dL
Nitrite: NEGATIVE
PROTEIN: 100 mg/dL — AB
Specific Gravity, Urine: 1.015 (ref 1.005–1.030)
WBC, UA: >50 WBC/hpf — ABNORMAL HIGH (ref 0–5)
pH: 5 (ref 5.0–8.0)

## 2017-10-05 LAB — URINE CULTURE

## 2017-10-07 ENCOUNTER — Other Ambulatory Visit
Admission: RE | Admit: 2017-10-07 | Discharge: 2017-10-07 | Disposition: A | Discharge status: Home / Self Care | Payer: Medicare Other | Source: Ambulatory Visit | Attending: Gerontology | Admitting: Gerontology

## 2017-10-07 ENCOUNTER — Non-Acute Institutional Stay: Payer: Medicare Other | Admitting: Gerontology

## 2017-10-07 DIAGNOSIS — J181 Lobar pneumonia, unspecified organism: Secondary | ICD-10-CM | POA: Diagnosis not present

## 2017-10-07 DIAGNOSIS — J189 Pneumonia, unspecified organism: Secondary | ICD-10-CM | POA: Diagnosis present

## 2017-10-07 LAB — COMPREHENSIVE METABOLIC PANEL
ALBUMIN: 3.1 g/dL — AB (ref 3.5–5.0)
ALT: 14 U/L (ref 14–54)
AST: 21 U/L (ref 15–41)
Alkaline Phosphatase: 120 U/L (ref 38–126)
Anion gap: 10 (ref 5–15)
BUN: 25 mg/dL — AB (ref 6–20)
CHLORIDE: 103 mmol/L (ref 101–111)
CO2: 20 mmol/L — AB (ref 22–32)
CREATININE: 0.8 mg/dL (ref 0.44–1.00)
Calcium: 8.8 mg/dL — ABNORMAL LOW (ref 8.9–10.3)
GFR calc Af Amer: >60 mL/min (ref >60)
GLUCOSE: 151 mg/dL — AB (ref 65–99)
Potassium: 4.9 mmol/L (ref 3.5–5.1)
Sodium: 133 mmol/L — ABNORMAL LOW (ref 135–145)
Total Bilirubin: 0.7 mg/dL (ref 0.3–1.2)
Total Protein: 7.1 g/dL (ref 6.5–8.1)

## 2017-10-08 ENCOUNTER — Other Ambulatory Visit
Admission: RE | Admit: 2017-10-08 | Discharge: 2017-10-08 | Disposition: A | Discharge status: Home / Self Care | Payer: Medicare Other | Source: Ambulatory Visit | Attending: Gerontology | Admitting: Gerontology

## 2017-10-08 DIAGNOSIS — J189 Pneumonia, unspecified organism: Secondary | ICD-10-CM | POA: Insufficient documentation

## 2017-10-08 LAB — CBC WITH DIFFERENTIAL/PLATELET
BASOS PCT: 0 %
Basophils Absolute: 0 10*3/uL (ref 0–0.1)
EOS ABS: 0.2 10*3/uL (ref 0–0.7)
Eosinophils Relative: 1 %
HCT: 35.2 % (ref 35.0–47.0)
HEMOGLOBIN: 11.8 g/dL — AB (ref 12.0–16.0)
Lymphocytes Relative: 18 %
Lymphs Abs: 2.5 10*3/uL (ref 1.0–3.6)
MCH: 29.6 pg (ref 26.0–34.0)
MCHC: 33.4 g/dL (ref 32.0–36.0)
MCV: 88.6 fL (ref 80.0–100.0)
Monocytes Absolute: 1.2 10*3/uL — ABNORMAL HIGH (ref 0.2–0.9)
Monocytes Relative: 9 %
NEUTROS ABS: 9.8 10*3/uL — AB (ref 1.4–6.5)
NEUTROS PCT: 72 %
Platelets: 314 10*3/uL (ref 150–440)
RBC: 3.97 MIL/uL (ref 3.80–5.20)
RDW: 14.1 % (ref 11.5–14.5)
WBC: 13.6 10*3/uL — AB (ref 3.6–11.0)

## 2017-10-08 LAB — COMPREHENSIVE METABOLIC PANEL
ALBUMIN: 2.9 g/dL — AB (ref 3.5–5.0)
ALK PHOS: 118 U/L (ref 38–126)
ALT: 15 U/L (ref 14–54)
AST: 21 U/L (ref 15–41)
Anion gap: 10 (ref 5–15)
BUN: 20 mg/dL (ref 6–20)
CALCIUM: 9 mg/dL (ref 8.9–10.3)
CO2: 20 mmol/L — AB (ref 22–32)
CREATININE: 0.8 mg/dL (ref 0.44–1.00)
Chloride: 105 mmol/L (ref 101–111)
GFR calc Af Amer: >60 mL/min (ref >60)
GFR calc non Af Amer: >60 mL/min (ref >60)
GLUCOSE: 133 mg/dL — AB (ref 65–99)
Potassium: 4.3 mmol/L (ref 3.5–5.1)
SODIUM: 135 mmol/L (ref 135–145)
Total Bilirubin: 0.7 mg/dL (ref 0.3–1.2)
Total Protein: 6.8 g/dL (ref 6.5–8.1)

## 2017-10-13 ENCOUNTER — Non-Acute Institutional Stay: Payer: Medicare Other | Admitting: Gerontology

## 2017-10-13 ENCOUNTER — Encounter: Payer: Self-pay | Admitting: Gerontology

## 2017-10-13 DIAGNOSIS — J181 Lobar pneumonia, unspecified organism: Secondary | ICD-10-CM

## 2017-10-13 DIAGNOSIS — E119 Type 2 diabetes mellitus without complications: Secondary | ICD-10-CM | POA: Diagnosis not present

## 2017-10-13 DIAGNOSIS — F0280 Dementia in other diseases classified elsewhere without behavioral disturbance: Secondary | ICD-10-CM | POA: Diagnosis not present

## 2017-10-13 DIAGNOSIS — J189 Pneumonia, unspecified organism: Secondary | ICD-10-CM

## 2017-10-18 DIAGNOSIS — J189 Pneumonia, unspecified organism: Secondary | ICD-10-CM | POA: Insufficient documentation

## 2017-10-18 DIAGNOSIS — J181 Lobar pneumonia, unspecified organism: Secondary | ICD-10-CM

## 2017-10-18 NOTE — Assessment & Plan Note (Addendum)
New onset cough and congestion. Started "several days ago." Expectorating thick, yellow/green secretions. Low grade fever, malaise. Tachycardia. Staying in bed more than usual. CXR obtained- Pneumonia +. Was initially started on PO Augmentin 875-125 mg BID when called about this over the weekend. Pt sx was not responsive to treatment.

## 2017-10-18 NOTE — Assessment & Plan Note (Signed)
Stable. FSBS checked daily. On PO Metformin 500 mg po BID. No recent episodes of hyper or hypoglycemia

## 2017-10-18 NOTE — Assessment & Plan Note (Signed)
Resolved. Pt received IV Rocephin x 4 days, then IM x 3 days. Pt reports she is still weak but feeling better. Cough improved. Afebrile.

## 2017-10-18 NOTE — Progress Notes (Signed)
Location:      Place of Service:  ALF (13) Provider:  Toni Arthurs, NP-C  Tracie Harrier, MD  Patient Care Team: Tracie Harrier, MD as PCP - General (Internal Medicine)  Extended Emergency Contact Information Primary Emergency Contact: Irwin,Cheryl E Address: [address]          [place], [place] [id number] [place] Phone: [phone] Mobile Phone: [phone] Relation: Daughter Secondary Emergency Contact: Philippa Sicks [place] Phone: [phone] Mobile Phone: [phone] Relation: Daughter  Code Status:  FULL Goals of care: Advanced Directive information Advanced Directives 10/13/2017  Does Patient Have a Medical Advance Directive? No  Would patient like information on creating a medical advance directive? -     Chief Complaint  Patient presents with  . Acute Visit    cough, malaise    HPI:  Pt is a 84 y.o. female seen today for an acute visit for   Pneumonia of both lower lobes due to infectious organism ([place]) New onset cough and congestion. Started "several days ago." Expectorating thick, yellow/green secretions. Low grade fever, malaise. Tachycardia. Staying in bed more than usual. CXR obtained- Pneumonia +. Was initially started on PO Augmentin 875-125 mg BID when called about this over the weekend. Pt sx was not responsive to treatment.    Past Medical History:  Diagnosis Date  . Diabetes mellitus ([place])    diet controlled  . Diabetes mellitus without complication ([place])   . Dry eye syndrome   . Hyperlipidemia, unspecified   . Hypertension   . Inflammatory arthritis   . Osteoporosis   . Postmenopausal    Past Surgical History:  Procedure Laterality Date  . ABDOMINAL HYSTERECTOMY    . APPENDECTOMY    . CAPSULOTOMY     OS  . CATARACT EXTRACTION W/PHACO Right 08/2008   crystalens 24.0D  . CATARACT EXTRACTION W/PHACO Left 01/18/2009   Crystalens 24.5D    Allergies  Allergen Reactions  .  Epinephrine Other (See Comments)    Other Reaction: Passed out after dental inject  . Latex Other (See Comments)    Other Reaction: angioedema, mouth burns  . Levofloxacin     Other reaction(s): Hallucination  . Other Other (See Comments)    Uncoded Allergy. Allergen: ENVIRONMENTAL ALLERGIES    Allergies as of 10/07/2017      Reactions   Epinephrine Other (See Comments)   Other Reaction: Passed out after dental inject   Latex Other (See Comments)   Other Reaction: angioedema, mouth burns   Levofloxacin    Other reaction(s): Hallucination   Other Other (See Comments)   Uncoded Allergy. Allergen: ENVIRONMENTAL ALLERGIES      Medication List        Accurate as of 10/07/17 11:59 PM. Always use your most recent med list.          atorvastatin 10 MG tablet Commonly known as:  LIPITOR Take 10 mg by mouth daily.   dexamethasone 0.5 MG/5ML elixir Take 0.5 mg by mouth 4 (four) times daily. (1 tbsp) Rinse for 1 minute.per Melina Copa Byerly   enalapril 20 MG tablet Commonly known as:  VASOTEC Take 20 mg by mouth 2 (two) times daily. 8 am and 5 pm   levothyroxine 50 MCG tablet Commonly known as:  SYNTHROID, LEVOTHROID Take 50 mcg by mouth daily before breakfast. 6 am   metFORMIN 500 MG tablet Commonly known as:  GLUCOPHAGE Take 500 mg by mouth 2 (two) times daily with a meal.  8 am and 5 pm   Phenol 0.5 % Liqd Use as directed 1 spray in the mouth or throat every 2 (two) hours as needed. Cherry flavor throat spray for sore throat until healed   potassium chloride 10 MEQ tablet Commonly known as:  K-DUR,KLOR-CON Take 20 mEq by mouth daily. 2 tabs   rivastigmine 9.5 mg/24hr Commonly known as:  EXELON Place 9.5 mg onto the skin daily. 8 am  Rotate sites.   TYLENOL ARTHRITIS PAIN 650 MG CR tablet Generic drug:  acetaminophen Take 1,300 mg by mouth 2 (two) times daily.       Review of Systems  Constitutional: Positive for activity change, appetite change, chills,  diaphoresis, fatigue and fever.  HENT: Positive for congestion. Negative for mouth sores, nosebleeds, postnasal drip, sneezing, sore throat, trouble swallowing and voice change.   Respiratory: Positive for cough, chest tightness and wheezing. Negative for apnea, choking and shortness of breath.   Cardiovascular: Negative for chest pain, palpitations and leg swelling.  Gastrointestinal: Negative for abdominal distention, abdominal pain, constipation, diarrhea and nausea.  Genitourinary: Negative for difficulty urinating, dysuria, frequency and urgency.  Musculoskeletal: Positive for myalgias. Negative for back pain and gait problem. Arthralgias: typical arthritis.  Skin: Negative for color change, pallor, rash and wound.  Neurological: Positive for weakness. Negative for dizziness, tremors, syncope, speech difficulty, numbness and headaches.  Psychiatric/Behavioral: Negative for agitation and behavioral problems.  All other systems reviewed and are negative.   Immunization History  Administered Date(s) Administered  . Influenza Inj Mdck Quad Pf 03/26/2016  . Influenza-Unspecified 02/16/2014, 02/17/2017  . Pneumococcal Polysaccharide-23 07/30/2015   Pertinent  Health Maintenance Due  Topic Date Due  . FOOT EXAM  12/29/1939  . OPHTHALMOLOGY EXAM  12/29/1939  . DEXA SCAN  12/29/1996  . PNA vac Low Risk Adult (2 of 2 - PCV13) 07/29/2016  . HEMOGLOBIN A1C  04/29/2017  . INFLUENZA VACCINE  12/24/2017   No flowsheet data found. Functional Status Survey:    Vitals:   10/06/17 1600  BP: (!) 165/67  Pulse: (!) 102  Resp: 20  Temp: 100.3 F (37.9 C)  SpO2: 97%   There is no height or weight on file to calculate BMI. Physical Exam  Constitutional: She is oriented to person, place, and time. Vital signs are normal. She appears well-developed and well-nourished. She appears lethargic. She is sleeping and cooperative. She appears ill. No distress.  HENT:  Head: Normocephalic and  atraumatic.  Mouth/Throat: Uvula is midline, oropharynx is clear and moist and mucous membranes are normal. Mucous membranes are not pale, not dry and not cyanotic.  Eyes: Pupils are equal, round, and reactive to light. Conjunctivae, EOM and lids are normal.  Neck: Trachea normal, normal range of motion and full passive range of motion without pain. Neck supple. No JVD present. No tracheal deviation, no edema and no erythema present. No thyromegaly present.  Cardiovascular: Normal heart sounds, intact distal pulses and normal pulses. An irregular rhythm present. Tachycardia present. Exam reveals no gallop, no distant heart sounds and no friction rub.  No murmur heard. Pulses:      Dorsalis pedis pulses are 2+ on the right side, and 2+ on the left side.  No edema  Pulmonary/Chest: Effort normal. No accessory muscle usage. No respiratory distress. She has decreased breath sounds in the right upper field and the left upper field. She has no wheezes. She has no rhonchi. She has rales in the right lower field and the left lower field.  She exhibits no tenderness.  Abdominal: Soft. Normal appearance and bowel sounds are normal. She exhibits no distension and no ascites. There is no tenderness.  Musculoskeletal: Normal range of motion. She exhibits no edema or tenderness.  Expected osteoarthritis, stiffness; Bilateral Calves soft, supple. Negative Homan's Sign. B- pedal pulses equal; generalized weakness/ malaise  Neurological: She is oriented to person, place, and time. She has normal strength. She appears lethargic.  Skin: Skin is warm and intact. She is diaphoretic. No cyanosis. There is pallor. Nails show no clubbing.  Psychiatric: She has a normal mood and affect. Her speech is normal and behavior is normal. Judgment and thought content normal. Cognition and memory are impaired. She exhibits abnormal recent memory.  Nursing note and vitals reviewed.   Labs reviewed: Recent Labs    04/18/17 0948  10/07/17 0630 10/08/17 0925  NA 139 133* 135  K 3.8 4.9 4.3  CL 105 103 105  CO2 24 20* 20*  GLUCOSE 195* 151* 133*  BUN 13 25* 20  CREATININE 0.57 0.80 0.80  CALCIUM 9.5 8.8* 9.0   Recent Labs    04/18/17 0948 10/07/17 0630 10/08/17 0925  AST _0 ALT _1 ALKPHOS 100 120 118  BILITOT 0.5 0.7 0.7  PROT 7.0 7.1 6.8  ALBUMIN 3.3* 3.1* 2.9*   Recent Labs    11/10/16 0530 04/18/17 0948 10/08/17 0925  WBC 10.8 7.7 13.6*  NEUTROABS 7.0* 4.4 9.8*  HGB 13.2 11.6* 11.8*  HCT 39.4 34.6* 35.2  MCV 89.9 87.8 88.6  PLT 307 331 314   Lab Results  Component Value Date   TSH 1.55 10/28/2016   Lab Results  Component Value Date   HGBA1C 6.9 10/28/2016   No results found for: CHOL, HDL, LDLCALC, LDLDIRECT, TRIG, CHOLHDL  Significant Diagnostic Results in last 30 days:  No results found.  Assessment/Plan Katie Logan was seen today for acute visit.  Diagnoses and all orders for this visit:  Pneumonia of both lower lobes due to infectious organism [facility])    Move upstairs temporarily to the skilled unit for IV abt  Insert and maintain peripheral IV for infusion of IVF and abt  0.9% NS 250 ml bolus over 1 hour, then continuous at 75 mL/hr x 72 hours  Rocephin 2 G IV Q day x 7 days  Azithromycin 500 mg PO Q Day x 7 days  Guaifenesin 10 mL po TID scheduled x 3 days  Duonebs TID scheduled for PNA  Monitor for worsening s/s  Encourage po fluid intake  Family/ staff Communication:   Total Time:  Documentation:  Face to Face:  Family/Phone:   Labs/tests ordered:  2-V CXR, CBC, Met C  Medication list reviewed and assessed for continued appropriateness.  Vikki Ports, NP-C Geriatrics [facility] [phone], [place] [address] Cell Phone (Mon-Fri 8am-5pm):  [phone] On Call:  [phone] & follow prompts after 5pm & weekends Office Phone:  [phone] Office Fax:  [phone]

## 2017-10-18 NOTE — Progress Notes (Signed)
Location:    Nursing Home Room Number: 979G Place of Service:  SNF (31) Provider:  Toni Arthurs, NP-C  Tracie Harrier, MD  Patient Care Team: Tracie Harrier, MD as PCP - General (Internal Medicine)  Extended Emergency Contact Information Primary Emergency Contact: Irwin,Cheryl E Address: [address]          [place], [place] [id number] [place] Phone: [phone] Mobile Phone: [phone] Relation: Daughter Secondary Emergency Contact: Philippa Sicks [place] Phone: [phone] Mobile Phone: [phone] Relation: Daughter  Code Status:  FULL Goals of care: Advanced Directive information Advanced Directives 10/13/2017  Does Patient Have a Medical Advance Directive? No  Would patient like information on creating a medical advance directive? -     Chief Complaint  Patient presents with  . Medical Management of Chronic Issues    Routine Visit    HPI:  Pt is a 84 y.o. female seen today for medical management of chronic diseases.    Diabetes mellitus without complication (H[facility]) Stable. FSBS checked daily. On PO Metformin 500 mg po BID. 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 No negative behaviors. Pleasant. Resides on Memory Care unit. Exelon patch 9.5 mg daily.  Pneumonia of both lower lobes due to infectious organism [facility]) Resolved. Pt received IV Rocephin x 4 days, then IM x 3 days. Pt reports she is still weak but feeling better. Cough improved. Afebrile.     Past Medical History:  Diagnosis Date  . Diabetes mellitus ([place])    diet controlled  . Diabetes mellitus without complication ([place])   . Dry eye syndrome   . Hyperlipidemia, unspecified   . Hypertension   . Inflammatory arthritis   . Osteoporosis   . Postmenopausal    Past Surgical History:  Procedure Laterality Date  . ABDOMINAL HYSTERECTOMY    .  APPENDECTOMY    . CAPSULOTOMY     OS  . CATARACT EXTRACTION W/PHACO Right 08/2008   crystalens 24.0D  . CATARACT EXTRACTION W/PHACO Left 01/18/2009   Crystalens 24.5D    Allergies  Allergen Reactions  . Epinephrine Other (See Comments)    Other Reaction: Passed out after dental inject  . Latex Other (See Comments)    Other Reaction: angioedema, mouth burns  . Levofloxacin     Other reaction(s): Hallucination  . Other Other (See Comments)    Uncoded Allergy. Allergen: ENVIRONMENTAL ALLERGIES    Allergies as of 10/13/2017      Reactions   Epinephrine Other (See Comments)   Other Reaction: Passed out after dental inject   Latex Other (See Comments)   Other Reaction: angioedema, mouth burns   Levofloxacin    Other reaction(s): Hallucination   Other Other (See Comments)   Uncoded Allergy. Allergen: ENVIRONMENTAL ALLERGIES      Medication List        Accurate as of 10/13/17 11:59 PM. Always use your most recent med list.          alum & mag hydroxide-simeth [phone] MG/5ML suspension Commonly known as:  MAALOX PLUS Take 30 mLs by mouth every 4 (four) hours as needed.   atorvastatin 10 MG tablet Commonly known as:  LIPITOR Take 10 mg by mouth daily.   azithromycin 250 MG tablet Commonly known as:  ZITHROMAX Take 500 mg by mouth daily.   cefTRIAXone 2 g injection Commonly known as:  ROCEPHIN Inject 2 g into the muscle daily. Reconstitute  with Lidocaine, give IM. for PNA   dexamethasone 0.5 MG/5ML elixir Take 0.5 mg by mouth 4 (four) times daily. (1 tbsp) Rinse for 1 minute.per Melina Copa Byerly   enalapril 20 MG tablet Commonly known as:  VASOTEC Take 20 mg by mouth 2 (two) times daily. 8 am and 5 pm   ipratropium-albuterol 0.5-2.5 (3) MG/3ML Soln Commonly known as:  DUONEB Take 3 mLs by nebulization 3 (three) times daily.   levothyroxine 50 MCG tablet Commonly known as:  SYNTHROID, LEVOTHROID Take 50 mcg by mouth daily before breakfast. 6 am   metFORMIN  500 MG tablet Commonly known as:  GLUCOPHAGE Take 500 mg by mouth 2 (two) times daily with a meal. 8 am and 5 pm   Phenol 0.5 % Liqd Use as directed 1 spray in the mouth or throat every 2 (two) hours as needed. Cherry flavor throat spray for sore throat until healed   potassium chloride 10 MEQ tablet Commonly known as:  K-DUR,KLOR-CON Take 20 mEq by mouth daily. 2 tabs   rivastigmine 9.5 mg/24hr Commonly known as:  EXELON Place 9.5 mg onto the skin daily. 8 am  Rotate sites.   TYLENOL ARTHRITIS PAIN 650 MG CR tablet Generic drug:  acetaminophen Take 1,300 mg by mouth 2 (two) times daily.   acetaminophen 325 MG tablet Commonly known as:  TYLENOL Take 650 mg by mouth every 4 (four) hours as needed. for pain/ increased temp. May be administered orally, per G-tube if needed or rectally if unable to swallow (separate order). Maximum dose for 24 hours is 3,000 mg from all sources of Acetaminophen/ Tylenol       Review of Systems  Constitutional: Negative for activity change, appetite change, chills, diaphoresis and fever.  HENT: Negative for congestion, mouth sores, nosebleeds, postnasal drip, sneezing, sore throat, trouble swallowing and voice change.   Respiratory: Positive for cough. Negative for apnea, choking, chest tightness, shortness of breath and wheezing.   Cardiovascular: Negative for chest pain, palpitations and leg swelling.  Gastrointestinal: Negative for abdominal distention, abdominal pain, constipation, diarrhea and nausea.  Genitourinary: Negative for difficulty urinating, dysuria, frequency and urgency.  Musculoskeletal: Positive for arthralgias (typical arthritis). Negative for back pain, gait problem and myalgias.  Skin: Negative for color change, pallor, rash and wound.  Neurological: Positive for weakness. Negative for dizziness, tremors, syncope, speech difficulty, numbness and headaches.  Psychiatric/Behavioral: Negative for agitation and behavioral problems.    All other systems reviewed and are negative.   Immunization History  Administered Date(s) Administered  . Influenza Inj Mdck Quad Pf 03/26/2016  . Influenza-Unspecified 02/16/2014, 02/17/2017  . Pneumococcal Polysaccharide-23 07/30/2015   Pertinent  Health Maintenance Due  Topic Date Due  . FOOT EXAM  12/29/1939  . OPHTHALMOLOGY EXAM  [birth year]1941  . DEXA SCAN  12/29/1996  . PNA vac Low Risk Adult (2 of 2 - PCV13) 07/29/2016  . HEMOGLOBIN A1C  04/29/2017  . INFLUENZA VACCINE  12/24/2017   No flowsheet data found. Functional Status Survey:    Vitals:   10/13/17 0906  BP: 137/78  Pulse: 82  Resp: 18  Temp: 97.8 F (36.6 C)  TempSrc: Oral  SpO2: 98%  Weight: 136 lb 7.4 oz (61.9 kg)  Height: '4\' 11"'$  (1.499 m)   Body mass index is 27.56 kg/m. Physical Exam  Constitutional: Vital signs are normal. She appears well-developed and well-nourished. She appears lethargic. She is active and cooperative. She does not appear ill. No distress.  HENT:  Head: Normocephalic and atraumatic.  Mouth/Throat: Uvula is midline, oropharynx is clear and moist and mucous membranes are normal. Mucous membranes are not pale, not dry and not cyanotic.  Eyes: Pupils are equal, round, and reactive to light. Conjunctivae, EOM and lids are normal.  Neck: Trachea normal, normal range of motion and full passive range of motion without pain. Neck supple. No JVD present. No tracheal deviation, no edema and no erythema present. No thyromegaly present.  Cardiovascular: Normal rate, regular rhythm, normal heart sounds, intact distal pulses and normal pulses. Exam reveals no gallop, no distant heart sounds and no friction rub.  No murmur heard. Pulses:      Dorsalis pedis pulses are 2+ on the right side, and 2+ on the left side.  No edema  Pulmonary/Chest: Effort normal. No accessory muscle usage. No respiratory distress. She has decreased breath sounds in the right lower field and the left lower field. She has  no wheezes. She has no rhonchi. She has no rales. She exhibits no tenderness.  Abdominal: Soft. Normal appearance and bowel sounds are normal. She exhibits no distension and no ascites. There is no tenderness.  Musculoskeletal: Normal range of motion. She exhibits no edema or tenderness.  Expected osteoarthritis, stiffness; Bilateral Calves soft, supple. Negative Homan's Sign. B- pedal pulses equal; generalized weakness  Neurological: She has normal strength. She appears lethargic.  Skin: Skin is warm, dry and intact. She is not diaphoretic. No cyanosis. No pallor. Nails show no clubbing.  Psychiatric: She has a normal mood and affect. Her speech is normal and behavior is normal. Judgment and thought content normal. Cognition and memory are impaired. She exhibits abnormal recent memory.  Nursing note and vitals reviewed.   Labs reviewed: Recent Labs    04/18/17 0948 10/07/17 0630 10/08/17 0925  NA 139 133* 135  K 3.8 4.9 4.3  CL 105 103 105  CO2 24 20* 20*  GLUCOSE 195* 151* 133*  BUN 13 25* 20  CREATININE 0.57 0.80 0.80  CALCIUM 9.5 8.8* 9.0   Recent Labs    04/18/17 0948 10/07/17 0630 10/08/17 0925  AST '25 21 21  '$ ALT '17 14 15  '$ ALKPHOS 100 120 118  BILITOT 0.5 0.7 0.7  PROT 7.0 7.1 6.8  ALBUMIN 3.3* 3.1* 2.9*   Recent Labs    11/10/16 0530 04/18/17 0948 10/08/17 0925  WBC 10.8 7.7 13.6*  NEUTROABS 7.0* 4.4 9.8*  HGB 13.2 11.6* 11.8*  HCT 39.4 34.6* 35.2  MCV 89.9 87.8 88.6  PLT 307 331 314   Lab Results  Component Value Date   TSH 1.55 10/28/2016   Lab Results  Component Value Date   HGBA1C 6.9 10/28/2016   No results found for: CHOL, HDL, LDLCALC, LDLDIRECT, TRIG, CHOLHDL  Significant Diagnostic Results in last 30 days:  No results found.  Assessment/Plan Katie Logan was seen today for medical management of chronic issues.  Diagnoses and all orders for this visit:  Dementia in other diseases classified elsewhere without behavioral  disturbance  Diabetes mellitus without complication ([place])  Pneumonia of both lower lobes due to infectious organism [facility])   Above listed conditions stable/ resolved  Continue current medication regimen  Continue to encourage cough, ambulation  Continue to encourage increased po fluid intake  Safety precautions   Fall precautions  Monitor for hypo or hyperglycemia  Labs next week  OK to return to AL  Family/ staff Communication:   Total Time:  Documentation:  Face to Face:  Family/Phone:   Labs/tests ordered:  Cbc, met c,  mag+, TSH, B12, D, lipid panel, A1c  Medication list reviewed and assessed for continued appropriateness. Monthly medication orders reviewed and signed.  Vikki Ports, NP-C Geriatrics [facility] [phone], [place] [address] Cell Phone (Mon-Fri 8am-5pm):  [phone] On Call:  [phone] & follow prompts after 5pm & weekends Office Phone:  [phone] Office Fax:  [phone]

## 2017-10-18 NOTE — Assessment & Plan Note (Signed)
Stable. No negative behaviors. Pleasant. Resides on Memory Care unit. Exelon patch 9.5 mg daily.

## 2017-10-24 ENCOUNTER — Encounter
Admission: RE | Admit: 2017-10-24 | Discharge: 2017-10-24 | Disposition: A | Discharge status: Home / Self Care | Payer: Medicare Other | Source: Ambulatory Visit | Attending: Internal Medicine | Admitting: Internal Medicine

## 2017-11-03 ENCOUNTER — Other Ambulatory Visit
Admission: RE | Admit: 2017-11-03 | Discharge: 2017-11-03 | Disposition: A | Discharge status: Home / Self Care | Payer: Medicare Other | Source: Ambulatory Visit | Attending: Gerontology | Admitting: Gerontology

## 2017-11-03 DIAGNOSIS — E119 Type 2 diabetes mellitus without complications: Secondary | ICD-10-CM | POA: Diagnosis present

## 2017-11-03 LAB — CBC WITH DIFFERENTIAL/PLATELET
BASOS ABS: 0 10*3/uL (ref 0–0.1)
Basophils Relative: 0 %
Eosinophils Absolute: 0.1 10*3/uL (ref 0–0.7)
Eosinophils Relative: 2 %
HEMATOCRIT: 33.3 % — AB (ref 35.0–47.0)
HEMOGLOBIN: 11.2 g/dL — AB (ref 12.0–16.0)
LYMPHS ABS: 3.8 10*3/uL — AB (ref 1.0–3.6)
Lymphocytes Relative: 42 %
MCH: 30.1 pg (ref 26.0–34.0)
MCHC: 33.6 g/dL (ref 32.0–36.0)
MCV: 89.5 fL (ref 80.0–100.0)
Monocytes Absolute: 0.5 10*3/uL (ref 0.2–0.9)
Monocytes Relative: 6 %
NEUTROS ABS: 4.5 10*3/uL (ref 1.4–6.5)
Neutrophils Relative %: 50 %
Platelets: 319 10*3/uL (ref 150–440)
RBC: 3.73 MIL/uL — AB (ref 3.80–5.20)
RDW: 14.2 % (ref 11.5–14.5)
WBC: 8.9 10*3/uL (ref 3.6–11.0)

## 2017-11-03 LAB — HEMOGLOBIN A1C
Hgb A1c MFr Bld: 5.9 % — ABNORMAL HIGH (ref 4.8–5.6)
Mean Plasma Glucose: 122.63 mg/dL

## 2017-11-17 ENCOUNTER — Non-Acute Institutional Stay: Payer: Medicare Other | Admitting: Gerontology

## 2017-11-17 ENCOUNTER — Encounter: Payer: Self-pay | Admitting: Gerontology

## 2017-11-17 DIAGNOSIS — M1990 Unspecified osteoarthritis, unspecified site: Secondary | ICD-10-CM | POA: Diagnosis not present

## 2017-11-17 DIAGNOSIS — E785 Hyperlipidemia, unspecified: Secondary | ICD-10-CM | POA: Diagnosis not present

## 2017-11-17 DIAGNOSIS — E876 Hypokalemia: Secondary | ICD-10-CM

## 2017-11-23 ENCOUNTER — Encounter
Admission: RE | Admit: 2017-11-23 | Discharge: 2017-11-23 | Disposition: A | Discharge status: Home / Self Care | Payer: Medicare Other | Source: Ambulatory Visit | Attending: Internal Medicine | Admitting: Internal Medicine

## 2017-11-23 NOTE — Assessment & Plan Note (Signed)
Stable. On Potassium Chloride 20 meq po Q day

## 2017-11-23 NOTE — Assessment & Plan Note (Signed)
Stable. On Lipitor 10 mg po Q Day. No c/o myalgias. Will check lipid panel this month

## 2017-11-23 NOTE — Progress Notes (Signed)
Location:    Nursing Home Room Number: 659D Place of Service:  ALF [phone]) Provider:  Toni Arthurs, NP-C  Tracie Harrier, MD  Patient Care Team: Tracie Harrier, MD as PCP - General (Internal Medicine)  Extended Emergency Contact Information Primary Emergency Contact: Irwin,Cheryl E Address: [address]          [place], [place] [id number] [place] Phone: [phone] Mobile Phone: [phone] Relation: Daughter Secondary Emergency Contact: Philippa Sicks [place] Phone: [phone] Mobile Phone: [phone] Relation: Daughter  Code Status:  FULL Goals of care: Advanced Directive information Advanced Directives 11/17/2017  Does Patient Have a Medical Advance Directive? No  Would patient like information on creating a medical advance directive? -     Chief Complaint  Patient presents with  . Medical Management of Chronic Issues    Routine Visit    HPI:  Pt is a 84 y.o. female seen today for medical management of chronic diseases.    Osteoarthritis Stable. No recent c/o worsening pain. On Tylenol Arthritis CR 650 mg 2 tablets po BID  Hyperlipidemia Stable. On Lipitor 10 mg po Q Day. No c/o myalgias. Will check lipid panel this month  Hypokalemia Stable. On Potassium Chloride 20 meq po Q day    Past Medical History:  Diagnosis Date  . Diabetes mellitus ([place])    diet controlled  . Diabetes mellitus without complication ([place])   . Dry eye syndrome   . Hyperlipidemia, unspecified   . Hypertension   . Inflammatory arthritis   . Osteoporosis   . Postmenopausal    Past Surgical History:  Procedure Laterality Date  . ABDOMINAL HYSTERECTOMY    . APPENDECTOMY    . CAPSULOTOMY     OS  . CATARACT EXTRACTION W/PHACO Right 08/2008   crystalens 24.0D  . CATARACT EXTRACTION W/PHACO Left 01/18/2009   Crystalens 24.5D    Allergies  Allergen Reactions  . Epinephrine Other (See Comments)    Other Reaction: Passed  out after dental inject  . Latex Other (See Comments)    Other Reaction: angioedema, mouth burns  . Levofloxacin     Other reaction(s): Hallucination  . Other Other (See Comments)    Uncoded Allergy. Allergen: ENVIRONMENTAL ALLERGIES    Allergies as of 11/17/2017      Reactions   Epinephrine Other (See Comments)   Other Reaction: Passed out after dental inject   Latex Other (See Comments)   Other Reaction: angioedema, mouth burns   Levofloxacin    Other reaction(s): Hallucination   Other Other (See Comments)   Uncoded Allergy. Allergen: ENVIRONMENTAL ALLERGIES      Medication List        Accurate as of 11/17/17 11:59 PM. Always use your most recent med list.          atorvastatin 10 MG tablet Commonly known as:  LIPITOR Take 10 mg by mouth daily.   dexamethasone 0.5 MG/5ML elixir Take 0.5 mg by mouth 4 (four) times daily. (1 tbsp) Rinse for 1 minute.per Melina Copa Byerly   enalapril 20 MG tablet Commonly known as:  VASOTEC Take 20 mg by mouth 2 (two) times daily. 8 am and 5 pm   levothyroxine 50 MCG tablet Commonly known as:  SYNTHROID, LEVOTHROID Take 50 mcg by mouth daily before breakfast. 6 am   metFORMIN 500 MG tablet Commonly known as:  GLUCOPHAGE Take 500 mg by mouth 2 (two) times daily with a meal. 8 am and 5 pm  potassium chloride 10 MEQ tablet Commonly known as:  K-DUR,KLOR-CON Take 20 mEq by mouth daily. 2 tabs   rivastigmine 9.5 mg/24hr Commonly known as:  EXELON Place 9.5 mg onto the skin daily. 8 am  Rotate sites.   TYLENOL ARTHRITIS PAIN 650 MG CR tablet Generic drug:  acetaminophen Take 1,300 mg by mouth 2 (two) times daily.       Review of Systems  Constitutional: Negative for activity change, appetite change, chills, diaphoresis and fever.  HENT: Negative for congestion, mouth sores, nosebleeds, postnasal drip, sneezing, sore throat, trouble swallowing and voice change.   Respiratory: Negative for apnea, cough, choking, chest tightness,  shortness of breath and wheezing.   Cardiovascular: Negative for chest pain, palpitations and leg swelling.  Gastrointestinal: Negative for abdominal distention, abdominal pain, constipation, diarrhea and nausea.  Genitourinary: Negative for difficulty urinating, dysuria, frequency and urgency.  Musculoskeletal: Positive for arthralgias (typical arthritis). Negative for back pain, gait problem and myalgias.  Skin: Negative for color change, pallor, rash and wound.  Neurological: Negative for dizziness, tremors, syncope, speech difficulty, weakness, numbness and headaches.  Psychiatric/Behavioral: Negative for agitation and behavioral problems.  All other systems reviewed and are negative.   Immunization History  Administered Date(s) Administered  . Influenza Inj Mdck Quad Pf 03/26/2016  . Influenza-Unspecified 02/16/2014, 02/17/2017  . Pneumococcal Polysaccharide-23 07/30/2015   Pertinent  Health Maintenance Due  Topic Date Due  . FOOT EXAM  12/29/1939  . OPHTHALMOLOGY EXAM  12/29/1939  . DEXA SCAN  12/29/1996  . PNA vac Low Risk Adult (2 of 2 - PCV13) 07/29/2016  . INFLUENZA VACCINE  12/24/2017  . HEMOGLOBIN A1C  05/05/2018   No flowsheet data found. Functional Status Survey:    Vitals:   11/17/17 0920  BP: (!) 156/62  Pulse: 89  Resp: 16  Temp: (!) 96 F (35.6 C)  TempSrc: Oral  SpO2: 99%  Weight: 130 lb 1.6 oz (59 kg)  Height: _0  (1.499 m)   Body mass index is 26.28 kg/m. Physical Exam  Constitutional: She is oriented to person, place, and time. Vital signs are normal. She appears well-developed and well-nourished. She is active and cooperative. She does not appear ill. No distress.  HENT:  Head: Normocephalic and atraumatic.  Mouth/Throat: Uvula is midline, oropharynx is clear and moist and mucous membranes are normal. Mucous membranes are not pale, not dry and not cyanotic.  Eyes: Pupils are equal, round, and reactive to light. Conjunctivae, EOM and lids are  normal.  Neck: Trachea normal, normal range of motion and full passive range of motion without pain. Neck supple. No JVD present. No tracheal deviation, no edema and no erythema present. No thyromegaly present.  Cardiovascular: Normal rate, normal heart sounds, intact distal pulses and normal pulses. An irregular rhythm present. Exam reveals no gallop, no distant heart sounds and no friction rub.  No murmur heard. Pulses:      Dorsalis pedis pulses are 2+ on the right side, and 2+ on the left side.  No edema  Pulmonary/Chest: Effort normal and breath sounds normal. No accessory muscle usage. No respiratory distress. She has no decreased breath sounds. She has no wheezes. She has no rhonchi. She has no rales. She exhibits no tenderness.  Abdominal: Soft. Normal appearance and bowel sounds are normal. She exhibits no distension and no ascites. There is no tenderness.  Musculoskeletal: Normal range of motion. She exhibits no edema or tenderness.  Expected osteoarthritis, stiffness; Bilateral Calves soft, supple. Negative Homan's Sign. B-  pedal pulses equal  Neurological: She is alert and oriented to person, place, and time. She has normal strength.  Skin: Skin is warm, dry and intact. She is not diaphoretic. No cyanosis. No pallor. Nails show no clubbing.  Psychiatric: She has a normal mood and affect. Her speech is normal and behavior is normal. Judgment and thought content normal. Cognition and memory are impaired. She exhibits abnormal recent memory.  Nursing note and vitals reviewed.   Labs reviewed: Recent Labs    04/18/17 0948 10/07/17 0630 10/08/17 0925  NA 139 133* 135  K 3.8 4.9 4.3  CL 105 103 105  CO2 24 20* 20*  GLUCOSE 195* 151* 133*  BUN 13 25* 20  CREATININE 0.57 0.80 0.80  CALCIUM 9.5 8.8* 9.0   Recent Labs    04/18/17 0948 10/07/17 0630 10/08/17 0925  AST _0 ALT _1 ALKPHOS 100 120 118  BILITOT 0.5 0.7 0.7  PROT 7.0 7.1 6.8  ALBUMIN 3.3* 3.1* 2.9*     Recent Labs    04/18/17 0948 10/08/17 0925 11/03/17 0400  WBC 7.7 13.6* 8.9  NEUTROABS 4.4 9.8* 4.5  HGB 11.6* 11.8* 11.2*  HCT 34.6* 35.2 33.3*  MCV 87.8 88.6 89.5  PLT 331 314 319   Lab Results  Component Value Date   TSH 1.55 10/28/2016   Lab Results  Component Value Date   HGBA1C 5.9 (H) 11/03/2017   No results found for: CHOL, HDL, LDLCALC, LDLDIRECT, TRIG, CHOLHDL  Significant Diagnostic Results in last 30 days:  No results found.  Assessment/Plan Katie Logan was seen today for medical management of chronic issues.  Diagnoses and all orders for this visit:  Osteoarthritis, unspecified osteoarthritis type, unspecified site  Hyperlipidemia, unspecified hyperlipidemia type  Hypokalemia   Above listed conditions stable  Continue current medication regimen  Labs this month  Monitor for muscle weakness or pain  Monitor for worsening OA pain  Safety precautions  Fall precautions  Family/ staff Communication:   Total Time:  Documentation:  Face to Face:  Family/Phone:   Labs/tests ordered:  Cbc, met c, mag, tsh, B12, D, A1c, lipid panel  Medication list reviewed and assessed for continued appropriateness. Monthly medication orders reviewed and signed.  Vikki Ports, NP-C Geriatrics [facility] [id number], [place] [address] Cell Phone (Mon-Fri 8am-5pm):  [phone] On Call:  [phone] & follow prompts after 5pm & weekends Office Phone:  [phone] Office Fax:  [phone]

## 2017-11-23 NOTE — Assessment & Plan Note (Signed)
Stable. No recent c/o worsening pain. On Tylenol Arthritis CR 650 mg 2 tablets po BID

## 2017-12-07 ENCOUNTER — Other Ambulatory Visit
Admission: RE | Admit: 2017-12-07 | Discharge: 2017-12-07 | Disposition: A | Discharge status: Home / Self Care | Payer: Medicare Other | Source: Ambulatory Visit | Attending: Gerontology | Admitting: Gerontology

## 2017-12-07 DIAGNOSIS — E119 Type 2 diabetes mellitus without complications: Secondary | ICD-10-CM | POA: Insufficient documentation

## 2017-12-07 DIAGNOSIS — I10 Essential (primary) hypertension: Secondary | ICD-10-CM | POA: Insufficient documentation

## 2017-12-07 LAB — COMPREHENSIVE METABOLIC PANEL
ALBUMIN: 4 g/dL (ref 3.5–5.0)
ALT: 14 U/L (ref 0–44)
AST: 20 U/L (ref 15–41)
Alkaline Phosphatase: 81 U/L (ref 38–126)
Anion gap: 9 (ref 5–15)
BUN: 20 mg/dL (ref 8–23)
CHLORIDE: 106 mmol/L (ref 98–111)
CO2: 26 mmol/L (ref 22–32)
CREATININE: 0.74 mg/dL (ref 0.44–1.00)
Calcium: 9.3 mg/dL (ref 8.9–10.3)
GFR calc Af Amer: >60 mL/min (ref >60)
GFR calc non Af Amer: >60 mL/min (ref >60)
Glucose, Bld: 100 mg/dL — ABNORMAL HIGH (ref 70–99)
POTASSIUM: 3.9 mmol/L (ref 3.5–5.1)
SODIUM: 141 mmol/L (ref 135–145)
TOTAL PROTEIN: 7.4 g/dL (ref 6.5–8.1)
Total Bilirubin: 0.6 mg/dL (ref 0.3–1.2)

## 2017-12-07 LAB — LIPID PANEL
CHOL/HDL RATIO: 3.5 ratio
Cholesterol: 146 mg/dL (ref 0–200)
HDL: 42 mg/dL (ref >40)
LDL CALC: 80 mg/dL (ref 0–99)
TRIGLYCERIDES: 120 mg/dL (ref <150)
VLDL: 24 mg/dL (ref 0–40)

## 2017-12-07 LAB — CBC WITH DIFFERENTIAL/PLATELET
BASOS ABS: 0 10*3/uL (ref 0–0.1)
Basophils Relative: 0 %
EOS ABS: 0.2 10*3/uL (ref 0–0.7)
EOS PCT: 2 %
HEMATOCRIT: 34.4 % — AB (ref 35.0–47.0)
HEMOGLOBIN: 11.6 g/dL — AB (ref 12.0–16.0)
Lymphocytes Relative: 44 %
Lymphs Abs: 3.6 10*3/uL (ref 1.0–3.6)
MCH: 30.4 pg (ref 26.0–34.0)
MCHC: 33.7 g/dL (ref 32.0–36.0)
MCV: 90.2 fL (ref 80.0–100.0)
MONO ABS: 0.5 10*3/uL (ref 0.2–0.9)
MONOS PCT: 6 %
Neutro Abs: 3.9 10*3/uL (ref 1.4–6.5)
Neutrophils Relative %: 48 %
Platelets: 256 10*3/uL (ref 150–440)
RBC: 3.81 MIL/uL (ref 3.80–5.20)
RDW: 13.7 % (ref 11.5–14.5)
WBC: 8.2 10*3/uL (ref 3.6–11.0)

## 2017-12-07 LAB — HEMOGLOBIN A1C
Hgb A1c MFr Bld: 5.5 % (ref 4.8–5.6)
MEAN PLASMA GLUCOSE: 111.15 mg/dL

## 2017-12-07 LAB — VITAMIN B12: VITAMIN B 12: 274 pg/mL (ref 180–914)

## 2017-12-07 LAB — MAGNESIUM: MAGNESIUM: 1.9 mg/dL (ref 1.7–2.4)

## 2017-12-07 LAB — TSH: TSH: 0.733 u[IU]/mL (ref 0.350–4.500)

## 2017-12-08 LAB — VITAMIN D 25 HYDROXY (VIT D DEFICIENCY, FRACTURES): VIT D 25 HYDROXY: 28.8 ng/mL — AB (ref 30.0–100.0)

## 2017-12-24 ENCOUNTER — Encounter
Admission: RE | Admit: 2017-12-24 | Discharge: 2017-12-24 | Disposition: A | Discharge status: Home / Self Care | Payer: Medicare Other | Source: Ambulatory Visit | Attending: Internal Medicine | Admitting: Internal Medicine

## 2018-01-04 DIAGNOSIS — Z593 Problems related to living in residential institution: Secondary | ICD-10-CM | POA: Insufficient documentation

## 2018-01-18 ENCOUNTER — Non-Acute Institutional Stay: Payer: Medicare Other | Admitting: Adult Health

## 2018-01-18 ENCOUNTER — Encounter: Payer: Self-pay | Admitting: Adult Health

## 2018-01-18 DIAGNOSIS — M159 Polyosteoarthritis, unspecified: Secondary | ICD-10-CM

## 2018-01-18 DIAGNOSIS — E559 Vitamin D deficiency, unspecified: Secondary | ICD-10-CM

## 2018-01-18 DIAGNOSIS — I10 Essential (primary) hypertension: Secondary | ICD-10-CM | POA: Diagnosis not present

## 2018-01-18 DIAGNOSIS — E1169 Type 2 diabetes mellitus with other specified complication: Secondary | ICD-10-CM | POA: Diagnosis not present

## 2018-01-18 DIAGNOSIS — M150 Primary generalized (osteo)arthritis: Secondary | ICD-10-CM

## 2018-01-18 DIAGNOSIS — E119 Type 2 diabetes mellitus without complications: Secondary | ICD-10-CM

## 2018-01-18 DIAGNOSIS — E785 Hyperlipidemia, unspecified: Secondary | ICD-10-CM

## 2018-01-18 DIAGNOSIS — G301 Alzheimer's disease with late onset: Secondary | ICD-10-CM | POA: Diagnosis not present

## 2018-01-18 DIAGNOSIS — E039 Hypothyroidism, unspecified: Secondary | ICD-10-CM

## 2018-01-18 NOTE — Progress Notes (Signed)
Location:   [facility] Room Number: [phone] Place of Service:  ALF (13)   CODE STATUS: FULL  Allergies  Allergen Reactions  . Epinephrine Other (See Comments)    Other Reaction: Passed out after dental inject  . Latex Other (See Comments)    Other Reaction: angioedema, mouth burns  . Levofloxacin     Other reaction(s): Hallucination  . Other Other (See Comments)    Uncoded Allergy. Allergen: ENVIRONMENTAL ALLERGIES    Chief Complaint  Patient presents with  . Medical Management of Chronic Issues    Hypertension; alzheimer's diease; diabetes.     HPI:  She is a 84 year old long term resident of assisted living being seen for the management of her chronic illnesses: hypertension; alzheimer's disease; and diabetes. She denies any uncontrolled joint pain; no headaches; no changes in appetite. There are no nursing concerns at this time.   Past Medical History:  Diagnosis Date  . Diabetes mellitus ([place])    diet controlled  . Diabetes mellitus without complication ([place])   . Dry eye syndrome   . Hyperlipidemia, unspecified   . Hypertension   . Inflammatory arthritis   . Osteoporosis   . Postmenopausal     Past Surgical History:  Procedure Laterality Date  . ABDOMINAL HYSTERECTOMY    . APPENDECTOMY    . CAPSULOTOMY     OS  . CATARACT EXTRACTION W/PHACO Right 08/2008   crystalens 24.0D  . CATARACT EXTRACTION [facility] Left 01/18/2009   Crystalens 24.5D    Social History   Socioeconomic History  . Marital status: Widowed    Spouse name: Not on file  . Number of children: 2  . Years of education: Not on file  . Highest education level: Not on file  Occupational History  . Not on file  Social Needs  . Financial resource strain: Not on file  . Food insecurity:    Worry: Not on file    Inability: Not on file  . Transportation needs:    Medical: Not on file    Non-medical: Not on file  Tobacco Use  . Smoking status: Former Smoker    Types:  Cigarettes    Last attempt to quit: 05/26/1968    Years since quitting: 49.6  . Smokeless tobacco: Never Used  Substance and Sexual Activity  . Alcohol use: No  . Drug use: No  . Sexual activity: Not on file  Lifestyle  . Physical activity:    Days per week: Not on file    Minutes per session: Not on file  . Stress: Not on file  Relationships  . Social connections:    Talks on phone: Not on file    Gets together: Not on file    Attends religious service: Not on file    Active member of club or organization: Not on file    Attends meetings of clubs or organizations: Not on file    Relationship status: Not on file  . Intimate partner violence:    Fear of current or ex partner: Not on file    Emotionally abused: Not on file    Physically abused: Not on file    Forced sexual activity: Not on file  Other Topics Concern  . Not on file  Social History Narrative   Admitted to [facility] 11/21/2016   Widowed   2 children   Former Smoker   Does not drink alcohol    Full Code  Family History  Problem Relation Age of Onset  . Alzheimer's disease Mother   . Hypertension Father   . Alzheimer's disease Sister   . Alzheimer's disease Sister       VITAL SIGNS BP (!) 174/76   Pulse 78   Temp 97.7 F (36.5 C) (Oral)   Resp 18   Ht 4\' 11"  (1.499 m)   Wt 132 lb 14.4 oz (60.3 kg)   SpO2 99%   BMI 26.84 kg/m   Outpatient Encounter Medications as of 01/18/2018  Medication Sig  . acetaminophen (TYLENOL ARTHRITIS PAIN) 650 MG CR tablet Take 1,300 mg by mouth 2 (two) times daily.   Marland Kitchen atorvastatin (LIPITOR) 10 MG tablet Take 10 mg by mouth daily.  . enalapril (VASOTEC) 20 MG tablet Take 20 mg by mouth 2 (two) times daily. 8 am and 5 pm  . levothyroxine (SYNTHROID, LEVOTHROID) 50 MCG tablet Take 50 mcg by mouth daily before breakfast. 6 am  . magnesium hydroxide (MILK OF MAGNESIA) 400 MG/5ML suspension Take 30 mLs by mouth daily as needed. For Constipation no results in 24  hours May Administer Bisacodyl Supp  . metFORMIN (GLUCOPHAGE) 500 MG tablet Take 500 mg by mouth 2 (two) times daily with a meal. 8 am and 5 pm  . NON FORMULARY Diet: Consistent carb, NAS  . potassium chloride (K-DUR,KLOR-CON) 10 MEQ tablet Take 20 mEq by mouth daily. 2 tabs  . rivastigmine (EXELON) 9.5 mg/24hr Place 9.5 mg onto the skin daily. 8 am  Rotate sites.  . [DISCONTINUED] dexamethasone 0.5 MG/5ML elixir Take 0.5 mg by mouth 4 (four) times daily. (1 tbsp) Rinse for 1 minute.per Melina Copa Byerly   No facility-administered encounter medications on file as of 01/18/2018.      SIGNIFICANT DIAGNOSTIC EXAMS  LABS REVIEWED: TODAY:   12-07-17: wbc 8.2; hgb 11.6; hct 34.4; mcv 90.2; plt 256; glucose 100; bun 20; creat 0.74 k+ 3.9; na++ 141; ca 9.3;liver normal albumin 4.0; mag  1.9; tsh 0.733; vit D 28.8; vit B 12: 274; hgb a1c 5.5 chol 146; ldl 80; trig 120; hdl 42    Review of Systems  Constitutional: Negative for malaise/fatigue.  Respiratory: Negative for cough and shortness of breath.   Cardiovascular: Negative for chest pain, palpitations and leg swelling.  Gastrointestinal: Negative for abdominal pain, constipation and heartburn.  Musculoskeletal: Negative for back pain, joint pain and myalgias.  Skin: Negative.   Neurological: Negative for dizziness.  Psychiatric/Behavioral: The patient is not nervous/anxious.     Physical Exam  Constitutional: She appears well-developed and well-nourished. No distress.  Neck: No thyromegaly present.  Cardiovascular: Normal rate, regular rhythm, normal heart sounds and intact distal pulses.  Pulmonary/Chest: Effort normal and breath sounds normal. No respiratory distress.  Abdominal: Soft. Bowel sounds are normal. She exhibits no distension. There is no tenderness.  Musculoskeletal: Normal range of motion. She exhibits no edema.  Lymphadenopathy:    She has no cervical adenopathy.  Neurological: She is alert.  Skin: Skin is warm and dry.  She is not diaphoretic.  Psychiatric: She has a normal mood and affect.     ASSESSMENT/ PLAN:  TODAY:   1. Essential primary hypertension: stable 174/76: vasotec 20 mg twice daily  2. Alzheimer's disease with late onset: is without change weight is 132 pounds; will continue exelon patch 9.5 mg daily   3. Non-insulin dependent type 2 diabetes mellitus: is stable hgb a1c 5.5; will continue metformin 500 mg twice daily   4.  Dyslipidemia associated  with type 2 diabetes mellitus: stable LDL 80; will continue lipitor 10 mg daily   5.  Primary osteoarthritis of multiple joints: is stable will continue tylenol 1300 mg twice daily   6. Hypokalemia: is stable k+ 3.9; will continue k+ 20 meq daily   7.  Hypothyroidism: is stable tsh 0.733: will continue synthroid 50 mcg daily   8. Vit D deficiency: is worse: vit D 28.8; will begin vit D 50,000 units weekly for 13 weeks will monitor  Will set up eye and foot exam Will need a dexa scan Pneumovax     MD is aware of resident's narcotic use and is in agreement with current plan of care. We will attempt to wean resident as apropriate   Ok Edwards NP [facility]  Contact [phone] Monday through Friday 8am- 5pm  After hours call [phone]

## 2018-01-19 ENCOUNTER — Other Ambulatory Visit: Payer: Self-pay | Admitting: Adult Health

## 2018-01-19 DIAGNOSIS — M810 Age-related osteoporosis without current pathological fracture: Secondary | ICD-10-CM

## 2018-01-24 ENCOUNTER — Encounter
Admission: RE | Admit: 2018-01-24 | Discharge: 2018-01-24 | Disposition: A | Discharge status: Home / Self Care | Payer: Medicare Other | Source: Ambulatory Visit | Attending: Internal Medicine | Admitting: Internal Medicine

## 2018-01-25 DIAGNOSIS — E785 Hyperlipidemia, unspecified: Secondary | ICD-10-CM

## 2018-01-25 DIAGNOSIS — E559 Vitamin D deficiency, unspecified: Secondary | ICD-10-CM | POA: Insufficient documentation

## 2018-01-25 DIAGNOSIS — E1169 Type 2 diabetes mellitus with other specified complication: Secondary | ICD-10-CM | POA: Insufficient documentation

## 2018-01-25 DIAGNOSIS — E119 Type 2 diabetes mellitus without complications: Secondary | ICD-10-CM | POA: Insufficient documentation

## 2018-02-23 ENCOUNTER — Encounter
Admission: RE | Admit: 2018-02-23 | Discharge: 2018-02-23 | Disposition: A | Discharge status: Home / Self Care | Payer: Medicare Other | Source: Ambulatory Visit | Attending: Internal Medicine | Admitting: Internal Medicine

## 2018-03-17 ENCOUNTER — Non-Acute Institutional Stay (SKILLED_NURSING_FACILITY): Payer: Medicare Other | Admitting: Adult Health

## 2018-03-17 ENCOUNTER — Encounter: Payer: Self-pay | Admitting: Adult Health

## 2018-03-17 DIAGNOSIS — M150 Primary generalized (osteo)arthritis: Secondary | ICD-10-CM | POA: Diagnosis not present

## 2018-03-17 DIAGNOSIS — M159 Polyosteoarthritis, unspecified: Secondary | ICD-10-CM

## 2018-03-17 DIAGNOSIS — E1169 Type 2 diabetes mellitus with other specified complication: Secondary | ICD-10-CM | POA: Diagnosis not present

## 2018-03-17 DIAGNOSIS — E876 Hypokalemia: Secondary | ICD-10-CM

## 2018-03-17 DIAGNOSIS — E039 Hypothyroidism, unspecified: Secondary | ICD-10-CM | POA: Diagnosis not present

## 2018-03-17 DIAGNOSIS — E785 Hyperlipidemia, unspecified: Secondary | ICD-10-CM

## 2018-03-17 NOTE — Progress Notes (Signed)
[facility] Room Number: [place] of Service:  ALF (13)   CODE STATUS: Full Code  Allergies  Allergen Reactions  . Epinephrine Other (See Comments)    Other Reaction: Passed out after dental inject  . Latex Other (See Comments)    Other Reaction: angioedema, mouth burns  . Levofloxacin     Other reaction(s): Hallucination  . Other Other (See Comments)    Uncoded Allergy. Allergen: ENVIRONMENTAL ALLERGIES    Chief Complaint  Patient presents with  . Medical Management of Chronic Issues    Dyslipidemia associated with type 2 diabetes mellitus; hypothyroidism unspecified; primary osteoarthritis involving multiple joints; hypokalemia.     HPI:  She is a 84 year old long term resident of assisted living being seen for the management of her chronic illnesses: dyslipidemia; hypothyroidism; osteoarthritis; hypokalemia. She denies any uncontrolled pain; no changes in appetite; no complaints of insomnia.   Past Medical History:  Diagnosis Date  . Diabetes mellitus ([place])    diet controlled  . Diabetes mellitus without complication ([place])   . Dry eye syndrome   . Hyperlipidemia, unspecified   . Hypertension   . Inflammatory arthritis   . Osteoporosis   . Postmenopausal     Past Surgical History:  Procedure Laterality Date  . ABDOMINAL HYSTERECTOMY    . APPENDECTOMY    . CAPSULOTOMY     OS  . CATARACT EXTRACTION W/PHACO Right 08/2008   crystalens 24.0D  . CATARACT EXTRACTION [facility] Left 01/18/2009   Crystalens 24.5D    Social History   Socioeconomic History  . Marital status: Widowed    Spouse name: Not on file  . Number of children: 2  . Years of education: Not on file  . Highest education level: Not on file  Occupational History  . Not on file  Social Needs  . Financial resource strain: Not on file  . Food insecurity:    Worry: Not on file    Inability: Not on file  . Transportation needs:    Medical: Not on file    Non-medical: Not on file  Tobacco Use  . Smoking status: Former Smoker    Types: Cigarettes    Last attempt to quit: 05/26/1968    Years since quitting: 49.8  . Smokeless tobacco: Never Used  Substance and Sexual Activity  . Alcohol use: No  . Drug use: No  . Sexual activity: Not on file  Lifestyle  . Physical activity:    Days per week: Not on file    Minutes per session: Not on file  . Stress: Not on file  Relationships  . Social connections:    Talks on phone: Not on file    Gets together: Not on file    Attends religious service: Not on file    Active member of club or organization: Not on file    Attends meetings of clubs or organizations: Not on file    Relationship status: Not on file  . Intimate partner violence:    Fear of current or ex partner: Not on file    Emotionally abused: Not on file    Physically abused: Not on file    Forced sexual activity: Not on file  Other Topics Concern  . Not on file  Social History Narrative   Admitted to [facility] 11/21/2016   Widowed   2 children   Former Smoker   Does not drink alcohol  Full Code   Family History  Problem Relation Age of Onset  . Alzheimer's disease Mother   . Hypertension Father   . Alzheimer's disease Sister   . Alzheimer's disease Sister       VITAL SIGNS BP (!) 155/57   Pulse 89   Temp 97.6 F (36.4 C)   Resp 18   Ht 4\' 11"  (1.499 m)   Wt 135 lb 12.8 oz (61.6 kg)   SpO2 98%   BMI 27.43 kg/m   Outpatient Encounter Medications as of 03/17/2018  Medication Sig  . acetaminophen (TYLENOL ARTHRITIS PAIN) 650 MG CR tablet Take 1,300 mg by mouth 2 (two) times daily.   Marland Kitchen atorvastatin (LIPITOR) 10 MG tablet Take 10 mg by mouth daily.  . enalapril (VASOTEC) 20 MG tablet Take 20 mg by mouth 2 (two) times daily. 8 am and 5 pm  . levothyroxine (SYNTHROID, LEVOTHROID) 50 MCG tablet Take 50 mcg by mouth daily before breakfast. 6 am  . magnesium hydroxide (MILK OF MAGNESIA) 400 MG/5ML  suspension Take 30 mLs by mouth daily as needed. For Constipation no results in 24 hours May Administer Bisacodyl Supp  . metFORMIN (GLUCOPHAGE) 500 MG tablet Take 500 mg by mouth 2 (two) times daily with a meal. 8 am and 5 pm  . NON FORMULARY Diet: Consistent carb, NAS  . potassium chloride (K-DUR,KLOR-CON) 10 MEQ tablet Take 20 mEq by mouth daily. 2 tabs  . rivastigmine (EXELON) 9.5 mg/24hr Place 9.5 mg onto the skin daily. 8 am  Rotate sites.  . Vitamin D, Ergocalciferol, (DRISDOL) 50000 units CAPS capsule Take 50,000 Units by mouth every 7 (seven) days. On Thursday   No facility-administered encounter medications on file as of 03/17/2018.      SIGNIFICANT DIAGNOSTIC EXAMS  LABS REVIEWED: PREVIOUS:   12-07-17: wbc 8.2; hgb 11.6; hct 34.4; mcv 90.2; plt 256; glucose 100; bun 20; creat 0.74 k+ 3.9; na++ 141; ca 9.3;liver normal albumin 4.0; mag  1.9; tsh 0.733; vit D 28.8; vit B 12: 274; hgb a1c 5.5 chol 146; ldl 80; trig 120; hdl 42   NO NEW LABS.    Review of Systems  Constitutional: Negative for malaise/fatigue.  Respiratory: Negative for cough and shortness of breath.   Cardiovascular: Negative for chest pain, palpitations and leg swelling.  Gastrointestinal: Negative for abdominal pain, constipation and heartburn.  Musculoskeletal: Negative for back pain, joint pain and myalgias.  Skin: Negative.   Neurological: Negative for dizziness.  Psychiatric/Behavioral: The patient is not nervous/anxious.      Physical Exam  Constitutional: She appears well-developed and well-nourished. No distress.  Neck: No thyromegaly present.  Cardiovascular: Normal rate, regular rhythm, normal heart sounds and intact distal pulses.  Pulmonary/Chest: Effort normal and breath sounds normal. No respiratory distress.  Abdominal: Soft. Bowel sounds are normal. She exhibits no distension. There is no tenderness.  Musculoskeletal: She exhibits no edema.  Is able to move all extremities     Lymphadenopathy:    She has no cervical adenopathy.  Neurological: She is alert.  Skin: Skin is warm and dry. She is not diaphoretic.  Psychiatric: She has a normal mood and affect.     ASSESSMENT/ PLAN:  TODAY:   1.  Dyslipidemia associated with type 2 diabetes mellitus: stable LDL 80; will continue lipitor 10 mg daily   2.  Primary osteoarthritis of multiple joints: is stable will continue tylenol 1300 mg twice daily   3. Hypokalemia: is stable k+ 3.9; will continue k+ 20 meq  daily   4.  Hypothyroidism, unspecified: is stable tsh 0.733: will continue synthroid 50 mcg daily   PREVIOUS  5. Vit D deficiency: is stable : vit D 28.8; will continue vit D 50,000 units weekly   6. Essential primary hypertension: stable 155/57: vasotec 20 mg twice daily  7. Alzheimer's disease with late onset: is without change weight is 135 pounds; will continue exelon patch 9.5 mg daily   8. Non-insulin dependent type 2 diabetes mellitus: is stable hgb a1c 5.5; will continue metformin 500 mg twice daily     MD is aware of resident's narcotic use and is in agreement with current plan of care. We will attempt to wean resident as apropriate   Ok Edwards NP [facility]  Contact [phone] Monday through Friday 8am- 5pm  After hours call [phone]

## 2018-03-24 ENCOUNTER — Encounter: Payer: Self-pay | Admitting: Adult Health

## 2018-03-24 ENCOUNTER — Non-Acute Institutional Stay: Payer: Medicare Other | Admitting: Adult Health

## 2018-03-24 ENCOUNTER — Other Ambulatory Visit
Admission: RE | Admit: 2018-03-24 | Discharge: 2018-03-24 | Disposition: A | Discharge status: Home / Self Care | Payer: Medicare Other | Source: Ambulatory Visit | Attending: Adult Health | Admitting: Adult Health

## 2018-03-24 DIAGNOSIS — R059 Cough, unspecified: Secondary | ICD-10-CM

## 2018-03-24 DIAGNOSIS — B370 Candidal stomatitis: Secondary | ICD-10-CM

## 2018-03-24 DIAGNOSIS — R509 Fever, unspecified: Secondary | ICD-10-CM | POA: Insufficient documentation

## 2018-03-24 DIAGNOSIS — R05 Cough: Secondary | ICD-10-CM

## 2018-03-24 DIAGNOSIS — E119 Type 2 diabetes mellitus without complications: Secondary | ICD-10-CM

## 2018-03-24 DIAGNOSIS — R0989 Other specified symptoms and signs involving the circulatory and respiratory systems: Secondary | ICD-10-CM | POA: Diagnosis not present

## 2018-03-24 LAB — CBC WITH DIFFERENTIAL/PLATELET
Abs Immature Granulocytes: 0.04 10*3/uL (ref 0.00–0.07)
BASOS ABS: 0 10*3/uL (ref 0.0–0.1)
BASOS PCT: 0 %
EOS ABS: 0.1 10*3/uL (ref 0.0–0.5)
Eosinophils Relative: 1 %
HCT: 35 % — ABNORMAL LOW (ref 36.0–46.0)
Hemoglobin: 11.4 g/dL — ABNORMAL LOW (ref 12.0–15.0)
IMMATURE GRANULOCYTES: 0 %
LYMPHS ABS: 2.1 10*3/uL (ref 0.7–4.0)
LYMPHS PCT: 22 %
MCH: 29.6 pg (ref 26.0–34.0)
MCHC: 32.6 g/dL (ref 30.0–36.0)
MCV: 90.9 fL (ref 80.0–100.0)
Monocytes Absolute: 0.7 10*3/uL (ref 0.1–1.0)
Monocytes Relative: 8 %
NEUTROS ABS: 6.3 10*3/uL (ref 1.7–7.7)
NRBC: 0 % (ref 0.0–0.2)
Neutrophils Relative %: 69 %
PLATELETS: 189 10*3/uL (ref 150–400)
RBC: 3.85 MIL/uL — AB (ref 3.87–5.11)
RDW: 12.8 % (ref 11.5–15.5)
WBC: 9.2 10*3/uL (ref 4.0–10.5)

## 2018-03-24 LAB — BASIC METABOLIC PANEL
ANION GAP: 8 (ref 5–15)
BUN: 17 mg/dL (ref 8–23)
CALCIUM: 9.3 mg/dL (ref 8.9–10.3)
CO2: 26 mmol/L (ref 22–32)
Chloride: 106 mmol/L (ref 98–111)
Creatinine, Ser: 0.83 mg/dL (ref 0.44–1.00)
GFR calc Af Amer: >60 mL/min (ref >60)
Glucose, Bld: 189 mg/dL — ABNORMAL HIGH (ref 70–99)
Potassium: 4.2 mmol/L (ref 3.5–5.1)
SODIUM: 140 mmol/L (ref 135–145)

## 2018-03-24 NOTE — Progress Notes (Addendum)
[facility]  [facility] Room Number: 248-P Place of Service:  ALF [phone]) Provider:  Durenda Age, NP  Patient Care Team: Tracie Harrier, MD as PCP - General (Internal Medicine)  Extended Emergency Contact Information Primary Emergency Contact: Irwin,Cheryl E Address: [address]          [place], [place] [id number] [place] Phone: [phone] Mobile Phone: [phone] Relation: Daughter Secondary Emergency Contact: Philippa Sicks [place] of [place] Phone: [phone] Mobile Phone: [phone] Relation: Daughter  Code Status:  Full Code  Goals of care: Advanced Directive information Advanced Directives 03/17/2018  Does Patient Have a Medical Advance Directive? No  Would patient like information on creating a medical advance directive? No - Patient declined     Chief Complaint  Patient presents with  . Acute Visit    Patient with URI symptoms    HPI:  Pt is an 84 y.o. female seen today for an acute visit due to URI symptoms. She is a long-term care resident of [facility].  She has a PMH of diabetes, HTN, HLD, and osteoporosis. She was seen in the room today. She said that her throat hurts. Noted to have thick brownish coating on her tongue. She had a fever of 101 F yesterday. She was noted to have occasional dry cough.    Past Medical History:  Diagnosis Date  . Diabetes mellitus ([place])    diet controlled  . Diabetes mellitus without complication ([place])   . Dry eye syndrome   . Hyperlipidemia, unspecified   . Hypertension   . Inflammatory arthritis   . Osteoporosis   . Postmenopausal    Past Surgical History:  Procedure Laterality Date  . ABDOMINAL HYSTERECTOMY    . APPENDECTOMY    . CAPSULOTOMY     OS  . CATARACT EXTRACTION W/PHACO Right 08/2008   crystalens 24.0D  . CATARACT EXTRACTION W/PHACO Left 01/18/2009   Crystalens 24.5D    Allergies  Allergen Reactions  . Epinephrine  Other (See Comments)    Other Reaction: Passed out after dental inject  . Latex Other (See Comments)    Other Reaction: angioedema, mouth burns  . Levofloxacin     Other reaction(s): Hallucination  . Other Other (See Comments)    Uncoded Allergy. Allergen: ENVIRONMENTAL ALLERGIES    Outpatient Encounter Medications as of 03/24/2018  Medication Sig  . acetaminophen (TYLENOL ARTHRITIS PAIN) 650 MG CR tablet Take 1,300 mg by mouth 2 (two) times daily.   Marland Kitchen atorvastatin (LIPITOR) 10 MG tablet Take 10 mg by mouth daily.  . enalapril (VASOTEC) 20 MG tablet Take 20 mg by mouth 2 (two) times daily. 8 am and 5 pm  . guaiFENesin (ROBITUSSIN) 100 MG/5ML liquid Take 200 mg by mouth 3 (three) times daily as needed for cough.  . levothyroxine (SYNTHROID, LEVOTHROID) 50 MCG tablet Take 50 mcg by mouth daily before breakfast. 6 am  . magnesium hydroxide (MILK OF MAGNESIA) 400 MG/5ML suspension Take 30 mLs by mouth daily as needed. For Constipation no results in 24 hours May Administer Bisacodyl Supp  . metFORMIN (GLUCOPHAGE) 500 MG tablet Take 500 mg by mouth 2 (two) times daily with a meal. 8 am and 5 pm  . NON FORMULARY Diet: Consistent carb, NAS  . potassium chloride (K-DUR,KLOR-CON) 10 MEQ tablet Take 20 mEq by mouth daily. 2 tabs  . rivastigmine (EXELON) 9.5 mg/24hr Place 9.5 mg onto the skin daily. 8 am  Rotate sites.  Marland Kitchen  Vitamin D, Ergocalciferol, (DRISDOL) 50000 units CAPS capsule Take 50,000 Units by mouth every 7 (seven) days. On Thursday   No facility-administered encounter medications on file as of 03/24/2018.     Review of Systems  GENERAL: No change in appetite, no fatigue, no weight changes, no fever, chills or weakness MOUTH and THROAT: Denies oral discomfort, gingival pain or bleeding RESPIRATORY: No cough, SOB, DOE, wheezing, hemoptysis CARDIAC: No chest pain, edema or palpitations GI: No abdominal pain, diarrhea, constipation, heart burn, nausea or vomiting GU: Denies dysuria,  frequency, hematuria, incontinence, or discharge PSYCHIATRIC: Denies feelings of depression or anxiety. No report of hallucinations, insomnia, paranoia, or agitation   Immunization History  Administered Date(s) Administered  . Influenza Inj Mdck Quad Pf 03/26/2016  . Influenza-Unspecified 02/16/2014, 02/17/2017, 03/11/2018  . Pneumococcal Polysaccharide-23 07/30/2015   Pertinent  Health Maintenance Due  Topic Date Due  . FOOT EXAM  04/19/2018 (Originally 12/29/1939)  . OPHTHALMOLOGY EXAM  04/19/2018 (Originally 12/29/1939)  . DEXA SCAN  04/19/2018 (Originally 12/29/1996)  . PNA vac Low Risk Adult (2 of 2 - PCV13) 04/19/2018 (Originally 07/29/2016)  . HEMOGLOBIN A1C  06/09/2018  . INFLUENZA VACCINE  Completed     Vitals:   03/24/18 1201  BP: (!) 155/57  Pulse: 89  Resp: 18  Temp: 97.6 F (36.4 C)  TempSrc: Oral  SpO2: 98%  Weight: 135 lb 12.8 oz (61.6 kg)  Height: 4\' 11"  (1.499 m)   Body mass index is 27.43 kg/m.  Physical Exam  GENERAL APPEARANCE: Well nourished. In no acute distress. Normal body habitus MOUTH and THROAT: Lips are without lesions. Oral mucosa is moist and without lesions. Tongue is normal in shape, size, and color and without lesions RESPIRATORY: Breathing is even & unlabored, BS CTAB CARDIAC: RRR, no murmur,no extra heart sounds, no edema GI: Abdomen soft, normal BS, no masses, no tenderness NEUROLOGICAL: There is no tremor. Speech is clear PSYCHIATRIC: Alert to self, disoriented to time and place. Affect and behavior are appropriate   Labs reviewed: Recent Labs    10/08/17 0925 12/07/17 0635 03/24/18 0120  NA 135 141 140  K 4.3 3.9 4.2  CL 105 106 106  CO2 20* 26 26  GLUCOSE 133* 100* 189*  BUN 20 20 17   CREATININE 0.80 0.74 0.83  CALCIUM 9.0 9.3 9.3  MG  --  1.9  --    Recent Labs    10/07/17 0630 10/08/17 0925 12/07/17 0635  AST 21 21 20   ALT 14 15 14   ALKPHOS 120 118 81  BILITOT 0.7 0.7 0.6  PROT 7.1 6.8 7.4  ALBUMIN 3.1* 2.9* 4.0    Recent Labs    11/03/17 0400 12/07/17 0635 03/24/18 0120  WBC 8.9 8.2 9.2  NEUTROABS 4.5 3.9 6.3  HGB 11.2* 11.6* 11.4*  HCT 33.3* 34.4* 35.0*  MCV 89.5 90.2 90.9  PLT 319 256 189   Lab Results  Component Value Date   TSH 0.733 12/07/2017   Lab Results  Component Value Date   HGBA1C 5.5 12/07/2017   Lab Results  Component Value Date   CHOL 146 12/07/2017   HDL 42 12/07/2017   LDLCALC 80 12/07/2017   TRIG 120 12/07/2017   CHOLHDL 3.5 12/07/2017    Assessment/Plan  1. Cough - she had fever yesterday, continue Guaifenesin PRN, no SOB, will get chest x-ray to rule out pneumonia   2. Oral candida - thick brownish coating on her tongue, will start Nystatin 100,000 units/ml 5 ml QID X 2 weeks,  oral care daily   3. Non-insulin dependent type 2 diabetes mellitus ([place]) -continue metformin 500 mg 1 tab twice a day Lab Results  Component Value Date   HGBA1C 5.5 12/07/2017      Family/ staff Communication:  Discussed plan of care with resident and charge nurse.  Labs/tests ordered:  Chest x-ray PA and lateral, CBC and BMP  Goals of care:   Assisted living care.   Durenda Age, NP [facility] [phone] (Monday-Friday 8:00 a.m. - 5:00 p.m.) [phone] (after hours)

## 2018-03-24 NOTE — Addendum Note (Signed)
Addended by: Durenda Age C on: 03/24/2018 04:27 PM   Modules accepted: Level of Service

## 2018-03-26 ENCOUNTER — Encounter
Admission: RE | Admit: 2018-03-26 | Discharge: 2018-03-26 | Disposition: A | Discharge status: Home / Self Care | Payer: Medicare Other | Source: Ambulatory Visit | Attending: Internal Medicine | Admitting: Internal Medicine

## 2018-03-26 DIAGNOSIS — R05 Cough: Secondary | ICD-10-CM | POA: Insufficient documentation

## 2018-03-26 DIAGNOSIS — R509 Fever, unspecified: Secondary | ICD-10-CM | POA: Diagnosis not present

## 2018-03-26 DIAGNOSIS — R0981 Nasal congestion: Secondary | ICD-10-CM | POA: Diagnosis not present

## 2018-03-26 LAB — INFLUENZA PANEL BY PCR (TYPE A & B)
INFLAPCR: NEGATIVE
Influenza B By PCR: NEGATIVE

## 2018-05-14 ENCOUNTER — Non-Acute Institutional Stay: Payer: Medicare Other | Admitting: Adult Health

## 2018-05-14 ENCOUNTER — Encounter: Payer: Self-pay | Admitting: Adult Health

## 2018-05-14 DIAGNOSIS — E119 Type 2 diabetes mellitus without complications: Secondary | ICD-10-CM

## 2018-05-14 DIAGNOSIS — I10 Essential (primary) hypertension: Secondary | ICD-10-CM | POA: Diagnosis not present

## 2018-05-14 DIAGNOSIS — G301 Alzheimer's disease with late onset: Secondary | ICD-10-CM | POA: Diagnosis not present

## 2018-05-14 NOTE — Progress Notes (Signed)
[facility]   [facility] Room Number: [place] of Service:  SNF (31)   CODE STATUS: Full Code  Allergies  Allergen Reactions  . Epinephrine Other (See Comments)    Other Reaction: Passed out after dental inject  . Latex Other (See Comments)    Other Reaction: angioedema, mouth burns  . Levofloxacin     Other reaction(s): Hallucination  . Other Other (See Comments)    Uncoded Allergy. Allergen: ENVIRONMENTAL ALLERGIES    Chief Complaint  Patient presents with  . Medical Management of Chronic Issues    Essential (primary) hypertension; non-insulin dependent type 2 diabetes mellitus; alzheimer's disease with late onset.     HPI:  She is a 84 year old long term resident of assisted living being seen for the management of her chronic illnesses; hypertension; diabetes; alzheimer's . She denies any uncontrolled pain; no changes in appetite; no insomnia and no anxiety.   Past Medical History:  Diagnosis Date  . Diabetes mellitus ([place])    diet controlled  . Diabetes mellitus without complication ([place])   . Dry eye syndrome   . Hyperlipidemia, unspecified   . Hypertension   . Inflammatory arthritis   . Osteoporosis   . Postmenopausal     Past Surgical History:  Procedure Laterality Date  . ABDOMINAL HYSTERECTOMY    . APPENDECTOMY    . CAPSULOTOMY     OS  . CATARACT EXTRACTION W/PHACO Right 08/2008   crystalens 24.0D  . CATARACT EXTRACTION [facility] Left 01/18/2009   Crystalens 24.5D    Social History   Socioeconomic History  . Marital status: Widowed    Spouse name: Not on file  . Number of children: 2  . Years of education: Not on file  . Highest education level: Not on file  Occupational History  . Not on file  Social Needs  . Financial resource strain: Not on file  . Food insecurity:    Worry: Not on file    Inability: Not on file  . Transportation needs:    Medical: Not on file    Non-medical: Not on file  Tobacco Use  .  Smoking status: Former Smoker    Types: Cigarettes    Last attempt to quit: 05/26/1968    Years since quitting: 50.0  . Smokeless tobacco: Never Used  Substance and Sexual Activity  . Alcohol use: No  . Drug use: No  . Sexual activity: Not on file  Lifestyle  . Physical activity:    Days per week: Not on file    Minutes per session: Not on file  . Stress: Not on file  Relationships  . Social connections:    Talks on phone: Not on file    Gets together: Not on file    Attends religious service: Not on file    Active member of club or organization: Not on file    Attends meetings of clubs or organizations: Not on file    Relationship status: Not on file  . Intimate partner violence:    Fear of current or ex partner: Not on file    Emotionally abused: Not on file    Physically abused: Not on file    Forced sexual activity: Not on file  Other Topics Concern  . Not on file  Social History Narrative   Admitted to [facility] 11/21/2016   Widowed   2 children   Former Smoker   Does not drink alcohol  Full Code   Family History  Problem Relation Age of Onset  . Alzheimer's disease Mother   . Hypertension Father   . Alzheimer's disease Sister   . Alzheimer's disease Sister       VITAL SIGNS BP 132/62   Pulse 68   Temp (!) 96 F (35.6 C)   Resp (!) 28   Ht 4\' 11"  (1.499 m)   Wt 135 lb (61.2 kg)   SpO2 96%   BMI 27.27 kg/m   Outpatient Encounter Medications as of 05/14/2018  Medication Sig  . acetaminophen (TYLENOL ARTHRITIS PAIN) 650 MG CR tablet Take 1,300 mg by mouth 2 (two) times daily.   Marland Kitchen atorvastatin (LIPITOR) 10 MG tablet Take 10 mg by mouth daily.  . enalapril (VASOTEC) 20 MG tablet Take 20 mg by mouth 2 (two) times daily. 8 am and 5 pm  . guaiFENesin (ROBITUSSIN) 100 MG/5ML liquid Take 200 mg by mouth 3 (three) times daily as needed for cough.  . levothyroxine (SYNTHROID, LEVOTHROID) 50 MCG tablet Take 50 mcg by mouth daily before breakfast. 6  am  . magnesium hydroxide (MILK OF MAGNESIA) 400 MG/5ML suspension Take 30 mLs by mouth daily as needed. For Constipation no results in 24 hours May Administer Bisacodyl Supp  . metFORMIN (GLUCOPHAGE) 500 MG tablet Take 500 mg by mouth 2 (two) times daily with a meal. 8 am and 5 pm  . NON FORMULARY Diet: Consistent carb, NAS  . potassium chloride (K-DUR,KLOR-CON) 10 MEQ tablet Take 20 mEq by mouth daily. 2 tabs  . rivastigmine (EXELON) 9.5 mg/24hr Place 9.5 mg onto the skin daily. 8 am  Rotate sites.  . [DISCONTINUED] Vitamin D, Ergocalciferol, (DRISDOL) 50000 units CAPS capsule Take 50,000 Units by mouth every 7 (seven) days. On Thursday   No facility-administered encounter medications on file as of 05/14/2018.      SIGNIFICANT DIAGNOSTIC EXAMS  LABS REVIEWED: PREVIOUS:   12-07-17: wbc 8.2; hgb 11.6; hct 34.4; mcv 90.2; plt 256; glucose 100; bun 20; creat 0.74 k+ 3.9; na++ 141; ca 9.3;liver normal albumin 4.0; mag  1.9; tsh 0.733; vit D 28.8; vit B 12: 274; hgb a1c 5.5 chol 146; ldl 80; trig 120; hdl 42   NO NEW LABS.    Review of Systems  Constitutional: Negative for malaise/fatigue.  Respiratory: Negative for cough and shortness of breath.   Cardiovascular: Negative for chest pain, palpitations and leg swelling.  Gastrointestinal: Negative for abdominal pain, constipation and heartburn.  Musculoskeletal: Negative for back pain, joint pain and myalgias.  Skin: Negative.   Neurological: Negative for dizziness.  Psychiatric/Behavioral: The patient is not nervous/anxious.     Physical Exam Constitutional:      General: She is not in acute distress.    Appearance: Normal appearance. She is well-developed. She is not diaphoretic.  Eyes:     Comments: History of bilateral cataract removal   Neck:     Musculoskeletal: Neck supple.     Thyroid: No thyromegaly.  Cardiovascular:     Rate and Rhythm: Normal rate and regular rhythm.     Pulses: Normal pulses.     Heart sounds: Normal  heart sounds.  Pulmonary:     Effort: Pulmonary effort is normal. No respiratory distress.     Breath sounds: Normal breath sounds.  Abdominal:     General: Bowel sounds are normal. There is no distension.     Palpations: Abdomen is soft.     Tenderness: There is no abdominal tenderness.  Musculoskeletal:  Right lower leg: No edema.     Left lower leg: No edema.     Comments: Is able to move all extremities   Lymphadenopathy:     Cervical: No cervical adenopathy.  Skin:    General: Skin is warm and dry.  Neurological:     Mental Status: She is alert. Mental status is at baseline.  Psychiatric:        Mood and Affect: Mood normal.     ASSESSMENT/ PLAN:  TODAY:   1. Essential primary hypertension: stable 132/62 vasotec 20 mg twice daily  2. Alzheimer's disease with late onset: is without change weight is 135 pounds; will continue exelon patch 9.5 mg daily   3. Non-insulin dependent type 2 diabetes mellitus: is stable hgb a1c 5.5; will continue metformin 500 mg twice daily   PREVIOUS  4.  Dyslipidemia associated with type 2 diabetes mellitus: stable LDL 80; will continue lipitor 10 mg daily   5.  Primary osteoarthritis of multiple joints: is stable will continue tylenol 1300 mg twice daily   6. Hypokalemia: is stable k+ 3.9; will continue k+ 20 meq daily   7.  Hypothyroidism, unspecified: is stable tsh 0.733: will continue synthroid 50 mcg daily    MD is aware of resident's narcotic use and is in agreement with current plan of care. We will attempt to wean resident as apropriate   Ok Edwards NP [facility]  Contact [phone] Monday through Friday 8am- 5pm  After hours call [phone]

## 2018-06-07 ENCOUNTER — Encounter
Admission: RE | Admit: 2018-06-07 | Discharge: 2018-06-07 | Disposition: A | Discharge status: Home / Self Care | Payer: Medicare Other | Source: Ambulatory Visit | Attending: Internal Medicine | Admitting: Internal Medicine

## 2018-06-07 DIAGNOSIS — R509 Fever, unspecified: Secondary | ICD-10-CM | POA: Insufficient documentation

## 2018-06-07 DIAGNOSIS — R0981 Nasal congestion: Secondary | ICD-10-CM | POA: Insufficient documentation

## 2018-06-07 DIAGNOSIS — R05 Cough: Secondary | ICD-10-CM | POA: Insufficient documentation

## 2018-06-26 ENCOUNTER — Encounter
Admission: RE | Admit: 2018-06-26 | Discharge: 2018-06-26 | Disposition: A | Discharge status: Home / Self Care | Payer: Medicare Other | Source: Ambulatory Visit | Attending: Internal Medicine | Admitting: Internal Medicine

## 2018-06-26 DIAGNOSIS — R509 Fever, unspecified: Secondary | ICD-10-CM | POA: Insufficient documentation

## 2018-06-26 DIAGNOSIS — R0981 Nasal congestion: Secondary | ICD-10-CM | POA: Insufficient documentation

## 2018-06-26 DIAGNOSIS — R05 Cough: Secondary | ICD-10-CM | POA: Insufficient documentation

## 2018-07-23 ENCOUNTER — Other Ambulatory Visit
Admission: RE | Admit: 2018-07-23 | Discharge: 2018-07-23 | Disposition: A | Discharge status: Home / Self Care | Payer: Medicare Other | Source: Ambulatory Visit | Attending: Internal Medicine | Admitting: Internal Medicine

## 2018-07-23 DIAGNOSIS — R410 Disorientation, unspecified: Secondary | ICD-10-CM | POA: Diagnosis present

## 2018-07-23 LAB — URINALYSIS, COMPLETE (UACMP) WITH MICROSCOPIC
Bilirubin Urine: NEGATIVE
Glucose, UA: 50 mg/dL — AB
Hgb urine dipstick: NEGATIVE
Ketones, ur: NEGATIVE mg/dL
Nitrite: NEGATIVE
PH: 5 (ref 5.0–8.0)
Protein, ur: NEGATIVE mg/dL
SPECIFIC GRAVITY, URINE: 1.025 (ref 1.005–1.030)

## 2018-07-24 LAB — URINE CULTURE: CULTURE: NO GROWTH

## 2018-07-27 ENCOUNTER — Encounter: Payer: Self-pay | Admitting: Adult Health

## 2018-07-27 ENCOUNTER — Non-Acute Institutional Stay: Payer: Medicare Other | Admitting: Adult Health

## 2018-07-27 DIAGNOSIS — I10 Essential (primary) hypertension: Secondary | ICD-10-CM

## 2018-07-27 DIAGNOSIS — E038 Other specified hypothyroidism: Secondary | ICD-10-CM

## 2018-07-27 DIAGNOSIS — E1169 Type 2 diabetes mellitus with other specified complication: Secondary | ICD-10-CM

## 2018-07-27 DIAGNOSIS — E785 Hyperlipidemia, unspecified: Secondary | ICD-10-CM

## 2018-07-27 DIAGNOSIS — E876 Hypokalemia: Secondary | ICD-10-CM

## 2018-07-27 DIAGNOSIS — M159 Polyosteoarthritis, unspecified: Secondary | ICD-10-CM

## 2018-07-27 DIAGNOSIS — E119 Type 2 diabetes mellitus without complications: Secondary | ICD-10-CM | POA: Diagnosis not present

## 2018-07-27 DIAGNOSIS — M150 Primary generalized (osteo)arthritis: Secondary | ICD-10-CM | POA: Diagnosis not present

## 2018-07-27 DIAGNOSIS — G301 Alzheimer's disease with late onset: Secondary | ICD-10-CM

## 2018-07-27 NOTE — Progress Notes (Signed)
[facility]:  [facility] Room Number: 248-P Place of Service:  ALF [phone]) Provider:  Durenda Age, NP  Patient Care Team: Tracie Harrier, MD as PCP - General (Internal Medicine) Kirk Ruths, MD (Internal Medicine) Gerlene Fee, NP as Nurse Practitioner (Geriatric Medicine)  Extended Emergency Contact Information Primary Emergency Contact: Irwin,Cheryl E Address: [address]          [place], [place] [id number] [place] of [place] Phone: [phone] Mobile Phone: [phone] Relation: Daughter Secondary Emergency Contact: Philippa Sicks [place] of [place] Phone: [phone] Mobile Phone: [phone] Relation: Daughter  Code Status:  Full Code  Goals of care: Advanced Directive information Advanced Directives 05/14/2018  Does Patient Have a Medical Advance Directive? No  Would patient like information on creating a medical advance directive? No - Patient declined     Chief Complaint  Patient presents with  . Medical Management of Chronic Issues    Routine [facility] visit    HPI:  Pt is an 85 y.o. female seen today for medical management of chronic diseases.  She is a long-term care resident of [facility]. She has a PMH of Alzheimer's disease, diabetes, HTN, HLD, and osteoporosis. She was seen today after her singing activity.  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 Latest BP 128/60. Her CBGs are noted to be well controlled.    Past Medical History:  Diagnosis Date  . Diabetes mellitus ([place])    diet controlled  . Diabetes mellitus without complication ([place])   . Dry eye syndrome   . Hyperlipidemia, unspecified   . Hypertension   . Inflammatory arthritis   . Osteoporosis   . Postmenopausal    Past Surgical History:  Procedure Laterality Date  . ABDOMINAL HYSTERECTOMY    . APPENDECTOMY    . CAPSULOTOMY     OS  . CATARACT  EXTRACTION W/PHACO Right 08/2008   crystalens 24.0D  . CATARACT EXTRACTION W/PHACO Left 01/18/2009   Crystalens 24.5D    Allergies  Allergen Reactions  . Epinephrine Other (See Comments)    Other Reaction: Passed out after dental inject  . Latex Other (See Comments)    Other Reaction: angioedema, mouth burns  . Levofloxacin     Other reaction(s): Hallucination  . Other Other (See Comments)    Uncoded Allergy. Allergen: ENVIRONMENTAL ALLERGIES    Outpatient Encounter Medications as of 07/27/2018  Medication Sig  . acetaminophen (TYLENOL ARTHRITIS PAIN) 650 MG CR tablet Take 1,300 mg by mouth 2 (two) times daily.   Marland Kitchen atorvastatin (LIPITOR) 10 MG tablet Take 10 mg by mouth daily.  . enalapril (VASOTEC) 20 MG tablet Take 20 mg by mouth 2 (two) times daily. 8 am and 5 pm  . guaiFENesin (ROBITUSSIN) 100 MG/5ML liquid Take 200 mg by mouth 3 (three) times daily as needed for cough.  . levothyroxine (SYNTHROID, LEVOTHROID) 50 MCG tablet Take 50 mcg by mouth daily before breakfast. 6 am  . magnesium hydroxide (MILK OF MAGNESIA) 400 MG/5ML suspension Take 30 mLs by mouth daily as needed. For Constipation no results in 24 hours May Administer Bisacodyl Supp  . metFORMIN (GLUCOPHAGE) 500 MG tablet Take 500 mg by mouth 2 (two) times daily with a meal. 8 am and 5 pm  . NON FORMULARY Diet: Consistent carb, NAS  . potassium chloride (K-DUR,KLOR-CON) 10 MEQ tablet Take 20 mEq by mouth daily. 2 tabs to =  20 mEq  . rivastigmine (EXELON) 9.5 mg/24hr Place 9.5 mg onto the skin daily. 8 am  Rotate sites.   No facility-administered encounter medications on file as of 07/27/2018.     Review of Systems  GENERAL: No change in appetite, no fatigue, no weight changes, no fever, chills or weakness MOUTH and THROAT: Denies oral discomfort, gingival pain or bleeding RESPIRATORY: no cough, SOB, DOE, wheezing, hemoptysis CARDIAC: No chest pain, edema or palpitations GI: No abdominal pain, diarrhea, constipation,  heart burn, nausea or vomiting NEUROLOGICAL: Denies dizziness, syncope, numbness, or headache PSYCHIATRIC: Denies feelings of depression or anxiety. No report of hallucinations, insomnia, paranoia, or agitation   Immunization History  Administered Date(s) Administered  . Influenza Inj Mdck Quad Pf 03/26/2016  . Influenza-Unspecified 02/16/2014, 02/17/2017, 03/11/2018  . Pneumococcal Polysaccharide-23 07/30/2015   Pertinent  Health Maintenance Due  Topic Date Due  . FOOT EXAM  12/29/1939  . DEXA SCAN  12/29/1996  . PNA vac Low Risk Adult (2 of 2 - PCV13) 07/29/2016  . HEMOGLOBIN A1C  06/09/2018  . OPHTHALMOLOGY EXAM  07/09/2019  . INFLUENZA VACCINE  Completed    Vitals:   07/27/18 0920  BP: 128/60  Pulse: 68  Resp: 18  Temp: (!) 96.6 F (35.9 C)  TempSrc: Oral  SpO2: 98%  Weight: 138 lb 4.8 oz (62.7 kg)  Height: 4\' 11"  (1.499 m)   Body mass index is 27.93 kg/m.  Physical Exam  GENERAL APPEARANCE: Well nourished. In no acute distress. Normal body habitus SKIN:  Skin is warm and dry.  MOUTH and THROAT: Lips are without lesions. Oral mucosa is moist and without lesions. Tongue is normal in shape, size, and color and without lesions RESPIRATORY: Breathing is even & unlabored, BS CTAB CARDIAC: RRR, no murmur,no extra heart sounds, no edema GI: Abdomen soft, normal BS, no masses, no tenderness EXTREMITIES:  Able to move X 4 extremities NEUROLOGICAL: There is no tremor. Speech is clear. Alert to self, disoriented to time and place. PSYCHIATRIC:  Affect and behavior are appropriate   Labs reviewed: Recent Labs    10/08/17 0925 12/07/17 0635 03/24/18 0120  NA 135 141 140  K 4.3 3.9 4.2  CL 105 106 106  CO2 20* 26 26  GLUCOSE 133* 100* 189*  BUN 20 20 17   CREATININE 0.80 0.74 0.83  CALCIUM 9.0 9.3 9.3  MG  --  1.9  --    Recent Labs    10/07/17 0630 10/08/17 0925 12/07/17 0635  AST 21 21 20   ALT 14 15 14   ALKPHOS 120 118 81  BILITOT 0.7 0.7 0.6  PROT 7.1  6.8 7.4  ALBUMIN 3.1* 2.9* 4.0   Recent Labs    11/03/17 0400 12/07/17 0635 03/24/18 0120  WBC 8.9 8.2 9.2  NEUTROABS 4.5 3.9 6.3  HGB 11.2* 11.6* 11.4*  HCT 33.3* 34.4* 35.0*  MCV 89.5 90.2 90.9  PLT 319 256 189   Lab Results  Component Value Date   TSH 0.733 12/07/2017   Lab Results  Component Value Date   HGBA1C 5.5 12/07/2017   Lab Results  Component Value Date   CHOL 146 12/07/2017   HDL 42 12/07/2017   LDLCALC 80 12/07/2017   TRIG 120 12/07/2017   CHOLHDL 3.5 12/07/2017    Assessment/Plan  1. Essential (primary) hypertension -Well-controlled, continue enalapril maleate 20 mg 1 tab twice a day  2. Non-insulin dependent type 2 diabetes mellitus (H[facility]) -Continue Metformin 500 mg 1 tab twice a day, check globin  A1c  3. Dyslipidemia associated with type 2 diabetes mellitus ([facility]) Lab Results  Component Value Date   CHOL 146 12/07/2017   HDL 42 12/07/2017   LDLCALC 80 12/07/2017   TRIG 120 12/07/2017   CHOLHDL 3.5 12/07/2017  -Continue atorvastatin 10 mg 1 tab daily   4. Primary osteoarthritis involving multiple joints -Stable, continue Tylenol arthritis 650 mg 2 tabs p.o. twice a day  5. Other specified hypothyroidism Lab Results  Component Value Date   TSH 0.733 12/07/2017  -Continue Synthroid 50 mcg 1 tab daily  6. Alzheimer's disease with late onset (CODE) ([place]) -Continue realistic mean 24-hour 9.5 mg / 24-hour 1 patch transdermal daily  7.  Hypokalemia Lab Results  Component Value Date   K 4.2 03/24/2018  -Continue Klor-Con 10 meq 2 tabs = 20 meq daily    Family/ staff Communication: Discussed plan of care with resident.  Labs/tests ordered: Hemoglobin A1c  Goals of care:   Long-term care.   Durenda Age, NP [facility] [phone] (Monday-Friday 8:00 a.m. - 5:00 p.m.) [phone] (after hours)

## 2018-07-28 ENCOUNTER — Encounter
Admission: RE | Admit: 2018-07-28 | Discharge: 2018-07-28 | Disposition: A | Discharge status: Home / Self Care | Payer: Medicare Other | Source: Ambulatory Visit | Attending: Internal Medicine | Admitting: Internal Medicine

## 2018-07-28 DIAGNOSIS — R05 Cough: Secondary | ICD-10-CM | POA: Insufficient documentation

## 2018-07-28 DIAGNOSIS — R0981 Nasal congestion: Secondary | ICD-10-CM | POA: Insufficient documentation

## 2018-07-28 DIAGNOSIS — R509 Fever, unspecified: Secondary | ICD-10-CM | POA: Insufficient documentation

## 2018-08-25 ENCOUNTER — Encounter
Admission: RE | Admit: 2018-08-25 | Discharge: 2018-08-25 | Disposition: A | Discharge status: Home / Self Care | Payer: Medicare Other | Source: Ambulatory Visit | Attending: Internal Medicine | Admitting: Internal Medicine

## 2018-08-25 DIAGNOSIS — R0981 Nasal congestion: Secondary | ICD-10-CM | POA: Insufficient documentation

## 2018-08-25 DIAGNOSIS — R509 Fever, unspecified: Secondary | ICD-10-CM | POA: Insufficient documentation

## 2018-08-25 DIAGNOSIS — R05 Cough: Secondary | ICD-10-CM | POA: Insufficient documentation

## 2018-09-24 ENCOUNTER — Encounter
Admission: RE | Admit: 2018-09-24 | Discharge: 2018-09-24 | Disposition: A | Discharge status: Home / Self Care | Payer: Medicare Other | Source: Ambulatory Visit | Attending: Internal Medicine | Admitting: Internal Medicine

## 2018-09-24 DIAGNOSIS — R05 Cough: Secondary | ICD-10-CM | POA: Insufficient documentation

## 2018-09-24 DIAGNOSIS — R0981 Nasal congestion: Secondary | ICD-10-CM | POA: Insufficient documentation

## 2018-09-24 DIAGNOSIS — R509 Fever, unspecified: Secondary | ICD-10-CM | POA: Insufficient documentation

## 2018-10-14 ENCOUNTER — Encounter: Payer: Self-pay | Admitting: Adult Health

## 2018-10-14 ENCOUNTER — Non-Acute Institutional Stay: Payer: Medicare Other | Admitting: Adult Health

## 2018-10-14 DIAGNOSIS — M159 Polyosteoarthritis, unspecified: Secondary | ICD-10-CM

## 2018-10-14 DIAGNOSIS — I10 Essential (primary) hypertension: Secondary | ICD-10-CM

## 2018-10-14 DIAGNOSIS — E785 Hyperlipidemia, unspecified: Secondary | ICD-10-CM

## 2018-10-14 DIAGNOSIS — E1169 Type 2 diabetes mellitus with other specified complication: Secondary | ICD-10-CM

## 2018-10-14 DIAGNOSIS — E876 Hypokalemia: Secondary | ICD-10-CM | POA: Diagnosis not present

## 2018-10-14 DIAGNOSIS — M150 Primary generalized (osteo)arthritis: Secondary | ICD-10-CM

## 2018-10-14 DIAGNOSIS — E119 Type 2 diabetes mellitus without complications: Secondary | ICD-10-CM

## 2018-10-14 DIAGNOSIS — E038 Other specified hypothyroidism: Secondary | ICD-10-CM

## 2018-10-14 DIAGNOSIS — G301 Alzheimer's disease with late onset: Secondary | ICD-10-CM

## 2018-10-14 NOTE — Progress Notes (Signed)
Location:  [facility] Room Number: 248-P Place of Service:  ALF [phone]) Provider:  Durenda Age, DNP, FNP-BC  Patient Care Team: Tracie Harrier, MD as PCP - General (Internal Medicine) Kirk Ruths, MD (Internal Medicine) Gerlene Fee, NP as Nurse Practitioner (Geriatric Medicine)  Extended Emergency Contact Information Primary Emergency Contact: Irwin,Cheryl E Address: [address]          [place], [place] [id number] Johnnette Litter of [place] Phone: [phone] Mobile Phone: [phone] Relation: Daughter Secondary Emergency Contact: Philippa Sicks [place] of [place] Phone: [phone] Mobile Phone: [phone] Relation: Daughter  Code Status:  Full Code  Goals of care: Advanced Directive information Advanced Directives 05/14/2018  Does Patient Have a Medical Advance Directive? No  Would patient like information on creating a medical advance directive? No - Patient declined     Chief Complaint  Patient presents with  . Medical Management of Chronic Issues    Routine TVAB ALF visit    HPI:  Pt is an 85 y.o. female seen today for medical management of chronic diseases.  She is a long-term care resident of [facility].  She has a PMH of Alzheimer's disease, diabetes, HTN, HLD, and osteoporosis. She was seen in the room today. She complained about her right eye being painful with no change in vision. Noted to have a small bump in the upper inner canthus. CBGs- 132, 137,115, 133. She takes Metformin for DM. BPs -156/83, 120/60, 138/63. She denies headache. She takes Enalapril for HTN.  Past Medical History:  Diagnosis Date  . Diabetes mellitus ([place])    diet controlled  . Diabetes mellitus without complication ([place])   . Dry eye syndrome   . Hyperlipidemia, unspecified   . Hypertension   . Inflammatory arthritis   . Osteoporosis   . Postmenopausal    Past Surgical History:  Procedure Laterality Date  .  ABDOMINAL HYSTERECTOMY    . APPENDECTOMY    . CAPSULOTOMY     OS  . CATARACT EXTRACTION W/PHACO Right 08/2008   crystalens 24.0D  . CATARACT EXTRACTION W/PHACO Left 01/18/2009   Crystalens 24.5D    Allergies  Allergen Reactions  . Epinephrine Other (See Comments)    Other Reaction: Passed out after dental inject  . Latex Other (See Comments)    Other Reaction: angioedema, mouth burns  . Levofloxacin     Other reaction(s): Hallucination  . Other Other (See Comments)    Uncoded Allergy. Allergen: ENVIRONMENTAL ALLERGIES    Outpatient Encounter Medications as of 10/14/2018  Medication Sig  . acetaminophen (TYLENOL ARTHRITIS PAIN) 650 MG CR tablet Take 1,300 mg by mouth 2 (two) times daily.   Marland Kitchen atorvastatin (LIPITOR) 10 MG tablet Take 10 mg by mouth daily.  . enalapril (VASOTEC) 20 MG tablet Take 20 mg by mouth 2 (two) times daily. 8 am and 5 pm  . guaiFENesin (ROBITUSSIN) 100 MG/5ML liquid Take 200 mg by mouth 3 (three) times daily as needed for cough.  . levothyroxine (SYNTHROID, LEVOTHROID) 50 MCG tablet Take 50 mcg by mouth daily before breakfast. 6 am  . magnesium hydroxide (MILK OF MAGNESIA) 400 MG/5ML suspension Take 30 mLs by mouth daily as needed. For Constipation no results in 24 hours May Administer Bisacodyl Supp  . metFORMIN (GLUCOPHAGE) 500 MG tablet Take 500 mg by mouth 2 (two) times daily with a meal. 8 am and 5 pm  . NON FORMULARY Diet: Consistent carb, NAS  . potassium chloride (K-DUR,KLOR-CON)  10 MEQ tablet Take 20 mEq by mouth daily. 2 tabs to = 20 mEq  . rivastigmine (EXELON) 9.5 mg/24hr Place 9.5 mg onto the skin daily. 8 am  Rotate sites.   No facility-administered encounter medications on file as of 10/14/2018.     Review of Systems  GENERAL: No change in appetite, no fatigue, no weight changes, no fever, chills or weakness EYES: Denies change in vision, + eye pain, + discharge MOUTH and THROAT: Denies oral discomfort, gingival pain or bleeding, pain from  teeth or hoarseness   RESPIRATORY: no cough, SOB, DOE, wheezing, hemoptysis CARDIAC: No chest pain, edema or palpitations GI: No abdominal pain, diarrhea, constipation, heart burn, nausea or vomiting GU: Denies dysuria, frequency, hematuria, incontinence, or discharge NEUROLOGICAL: Denies dizziness, syncope, numbness, or headache PSYCHIATRIC: Denies feelings of depression or anxiety. No report of hallucinations, insomnia, paranoia, or agitation    Immunization History  Administered Date(s) Administered  . Influenza Inj Mdck Quad Pf 03/26/2016  . Influenza-Unspecified 02/16/2014, 02/17/2017, 03/11/2018  . Pneumococcal Conjugate-13 07/27/2018  . Pneumococcal Polysaccharide-23 07/30/2015   Pertinent  Health Maintenance Due  Topic Date Due  . FOOT EXAM  12/29/1939  . DEXA SCAN  12/29/1996  . HEMOGLOBIN A1C  06/09/2018  . INFLUENZA VACCINE  12/25/2018  . OPHTHALMOLOGY EXAM  07/09/2019  . PNA vac Low Risk Adult  Completed    Vitals:   10/14/18 1151  BP: (!) 156/83  Pulse: 93  Resp: 18  Temp: 97.6 F (36.4 C)  TempSrc: Oral  SpO2: 98%  Weight: 138 lb 4.8 oz (62.7 kg)  Height: 4\' 11"  (1.499 m)   Body mass index is 27.93 kg/m.  Physical Exam  GENERAL APPEARANCE: Well nourished. In no acute distress. Normal body habitus SKIN:  Skin is warm and dry.  EYES: Lids open and close normally. Right upper inner canthus stye MOUTH and THROAT: Lips are without lesions. Oral mucosa is moist and without lesions. Tongue is normal in shape, size, and color and without lesions RESPIRATORY: Breathing is even & unlabored, BS CTAB CARDIAC: RRR, no murmur,no extra heart sounds, no edema GI: Abdomen soft, normal BS, no masses, no tenderness EXTREMITIES:  Able to move X 4 extremities NEUROLOGICAL: There is no tremor. Speech is clear. Alert to self, disoriented to time and place. PSYCHIATRIC:  Affect and behavior are appropriate  Labs reviewed: Recent Labs    12/07/17 0635 03/24/18 0120   NA 141 140  K 3.9 4.2  CL 106 106  CO2 26 26  GLUCOSE 100* 189*  BUN 20 17  CREATININE 0.74 0.83  CALCIUM 9.3 9.3  MG 1.9  --    Recent Labs    12/07/17 0635  AST 20  ALT 14  ALKPHOS 81  BILITOT 0.6  PROT 7.4  ALBUMIN 4.0   Recent Labs    11/03/17 0400 12/07/17 0635 03/24/18 0120  WBC 8.9 8.2 9.2  NEUTROABS 4.5 3.9 6.3  HGB 11.2* 11.6* 11.4*  HCT 33.3* 34.4* 35.0*  MCV 89.5 90.2 90.9  PLT 319 256 189   Lab Results  Component Value Date   TSH 0.733 12/07/2017   Lab Results  Component Value Date   HGBA1C 5.5 12/07/2017   Lab Results  Component Value Date   CHOL 146 12/07/2017   HDL 42 12/07/2017   LDLCALC 80 12/07/2017   TRIG 120 12/07/2017   CHOLHDL 3.5 12/07/2017    Assessment/Plan  1. Essential (primary) hypertension - noted to have an elevated reading, will check BP  BID X 1 week, continue enalapril 20 mg 1 tab twice a day  2. Non-insulin dependent type 2 diabetes mellitus ([place]) Lab Results  Component Value Date   HGBA1C 5.5 12/07/2017  -Well-controlled, continue metformin 500 mg 1 tab twice a day  3. Other specified hypothyroidism Lab Results  Component Value Date   TSH 0.733 12/07/2017  -Continue Synthroid 50 mcg 1 tab daily, check tsh  4. Hypokalemia Lab Results  Component Value Date   K 4.2 03/24/2018  -Continue Klor-Con 10 MEQ 2 tabs = 20 meq twice a day - check CMP   5. Primary osteoarthritis involving multiple joints -Stable, continue Tylenol arthritis 650 mg 2 tabs twice a day  6. Dyslipidemia associated with type 2 diabetes mellitus [facility]) Lab Results  Component Value Date   CHOL 146 12/07/2017   HDL 42 12/07/2017   LDLCALC 80 12/07/2017   TRIG 120 12/07/2017   CHOLHDL 3.5 12/07/2017  -Continue atorvastatin 10 mg 1 tab daily  7. Alzheimer's disease with late onset (CODE) ([place]) -Continue realistic mean 24-hour 9.5 mg/24-hour 1 patch transdermal daily, supportive care and fall precautions     Family/ staff  Communication: Discussed plan of care with resident and charge nurse.  Labs/tests ordered: TSH, CMP, lipid panel and A1c  Goals of care:   Long-term care.   Durenda Age, DNP, FNP-BC [facility] and Adult Medicine [phone] (Monday-Friday 8:00 a.m. - 5:00 p.m.) [phone] (after hours)

## 2018-10-25 ENCOUNTER — Encounter
Admission: RE | Admit: 2018-10-25 | Discharge: 2018-10-25 | Disposition: A | Discharge status: Home / Self Care | Payer: Medicare Other | Source: Ambulatory Visit | Attending: Internal Medicine | Admitting: Internal Medicine

## 2018-10-25 ENCOUNTER — Other Ambulatory Visit
Admission: RE | Admit: 2018-10-25 | Discharge: 2018-10-25 | Disposition: A | Discharge status: Home / Self Care | Payer: Medicare Other | Source: Skilled Nursing Facility | Attending: Adult Health | Admitting: Adult Health

## 2018-10-25 DIAGNOSIS — R509 Fever, unspecified: Secondary | ICD-10-CM | POA: Insufficient documentation

## 2018-10-25 DIAGNOSIS — R0981 Nasal congestion: Secondary | ICD-10-CM | POA: Insufficient documentation

## 2018-10-25 DIAGNOSIS — R05 Cough: Secondary | ICD-10-CM | POA: Insufficient documentation

## 2018-10-25 DIAGNOSIS — E039 Hypothyroidism, unspecified: Secondary | ICD-10-CM | POA: Diagnosis present

## 2018-10-25 LAB — COMPREHENSIVE METABOLIC PANEL
ALT: 16 U/L (ref 0–44)
AST: 21 U/L (ref 15–41)
Albumin: 4.7 g/dL (ref 3.5–5.0)
Alkaline Phosphatase: 72 U/L (ref 38–126)
Anion gap: 12 (ref 5–15)
BUN: 25 mg/dL — ABNORMAL HIGH (ref 8–23)
CO2: 25 mmol/L (ref 22–32)
Calcium: 9.8 mg/dL (ref 8.9–10.3)
Chloride: 104 mmol/L (ref 98–111)
Creatinine, Ser: 0.96 mg/dL (ref 0.44–1.00)
GFR calc Af Amer: >60 mL/min (ref >60)
GFR calc non Af Amer: 54 mL/min — ABNORMAL LOW (ref >60)
Glucose, Bld: 128 mg/dL — ABNORMAL HIGH (ref 70–99)
Potassium: 4 mmol/L (ref 3.5–5.1)
Sodium: 141 mmol/L (ref 135–145)
Total Bilirubin: 0.4 mg/dL (ref 0.3–1.2)
Total Protein: 7.4 g/dL (ref 6.5–8.1)

## 2018-10-25 LAB — HEMOGLOBIN A1C
Hgb A1c MFr Bld: 6 % — ABNORMAL HIGH (ref 4.8–5.6)
Mean Plasma Glucose: 125.5 mg/dL

## 2018-10-25 LAB — LIPID PANEL
Cholesterol: 177 mg/dL (ref 0–200)
HDL: 40 mg/dL — ABNORMAL LOW (ref >40)
LDL Cholesterol: 73 mg/dL (ref 0–99)
Total CHOL/HDL Ratio: 4.4 RATIO
Triglycerides: 319 mg/dL — ABNORMAL HIGH (ref <150)
VLDL: 64 mg/dL — ABNORMAL HIGH (ref 0–40)

## 2018-10-25 LAB — TSH: TSH: 0.924 u[IU]/mL (ref 0.350–4.500)

## 2018-11-24 ENCOUNTER — Encounter
Admission: RE | Admit: 2018-11-24 | Discharge: 2018-11-24 | Disposition: A | Discharge status: Home / Self Care | Payer: Medicare Other | Source: Ambulatory Visit | Attending: Internal Medicine | Admitting: Internal Medicine

## 2018-12-25 ENCOUNTER — Encounter
Admission: RE | Admit: 2018-12-25 | Discharge: 2018-12-25 | Disposition: A | Discharge status: Home / Self Care | Payer: Medicare Other | Source: Ambulatory Visit | Attending: Internal Medicine | Admitting: Internal Medicine

## 2019-01-25 ENCOUNTER — Encounter
Admission: RE | Admit: 2019-01-25 | Discharge: 2019-01-25 | Disposition: A | Discharge status: Home / Self Care | Payer: Medicare Other | Source: Ambulatory Visit | Attending: Internal Medicine | Admitting: Internal Medicine

## 2019-02-25 ENCOUNTER — Encounter
Admission: RE | Admit: 2019-02-25 | Discharge: 2019-02-25 | Disposition: A | Discharge status: Home / Self Care | Payer: Medicare Other | Source: Ambulatory Visit | Attending: Internal Medicine | Admitting: Internal Medicine

## 2019-04-04 ENCOUNTER — Encounter
Admission: RE | Admit: 2019-04-04 | Discharge: 2019-04-04 | Disposition: A | Discharge status: Home / Self Care | Payer: Medicare Other | Source: Ambulatory Visit | Attending: Internal Medicine | Admitting: Internal Medicine

## 2019-07-14 ENCOUNTER — Encounter
Admission: RE | Admit: 2019-07-14 | Discharge: 2019-07-14 | Disposition: A | Discharge status: Home / Self Care | Payer: Medicare Other | Source: Ambulatory Visit | Attending: Internal Medicine | Admitting: Internal Medicine

## 2019-07-15 ENCOUNTER — Emergency Department: Payer: Medicare Other

## 2019-07-15 ENCOUNTER — Emergency Department
Admission: EM | Admit: 2019-07-15 | Discharge: 2019-07-15 | Disposition: A | Discharge status: Home / Self Care | Payer: Medicare Other | Attending: Student in an Organized Health Care Education/Training Program | Admitting: Student in an Organized Health Care Education/Training Program

## 2019-07-15 ENCOUNTER — Other Ambulatory Visit: Payer: Self-pay

## 2019-07-15 DIAGNOSIS — R1032 Left lower quadrant pain: Secondary | ICD-10-CM | POA: Insufficient documentation

## 2019-07-15 DIAGNOSIS — R109 Unspecified abdominal pain: Secondary | ICD-10-CM

## 2019-07-15 DIAGNOSIS — Z79899 Other long term (current) drug therapy: Secondary | ICD-10-CM | POA: Diagnosis not present

## 2019-07-15 DIAGNOSIS — I10 Essential (primary) hypertension: Secondary | ICD-10-CM | POA: Diagnosis not present

## 2019-07-15 LAB — URINALYSIS, COMPLETE (UACMP) WITH MICROSCOPIC
Bacteria, UA: NONE SEEN
Bilirubin Urine: NEGATIVE
Glucose, UA: NEGATIVE mg/dL
Ketones, ur: NEGATIVE mg/dL
Leukocytes,Ua: NEGATIVE
Nitrite: NEGATIVE
Protein, ur: NEGATIVE mg/dL
Specific Gravity, Urine: 1.02 (ref 1.005–1.030)
Squamous Epithelial / LPF: NONE SEEN (ref 0–5)
pH: 7 (ref 5.0–8.0)

## 2019-07-15 LAB — CBC WITH DIFFERENTIAL/PLATELET
Abs Immature Granulocytes: 0.02 10*3/uL (ref 0.00–0.07)
Basophils Absolute: 0 10*3/uL (ref 0.0–0.1)
Basophils Relative: 0 %
Eosinophils Absolute: 0.2 10*3/uL (ref 0.0–0.5)
Eosinophils Relative: 3 %
HCT: 38.4 % (ref 36.0–46.0)
Hemoglobin: 12.5 g/dL (ref 12.0–15.0)
Immature Granulocytes: 0 %
Lymphocytes Relative: 36 %
Lymphs Abs: 2.8 10*3/uL (ref 0.7–4.0)
MCH: 29.6 pg (ref 26.0–34.0)
MCHC: 32.6 g/dL (ref 30.0–36.0)
MCV: 91 fL (ref 80.0–100.0)
Monocytes Absolute: 0.5 10*3/uL (ref 0.1–1.0)
Monocytes Relative: 7 %
Neutro Abs: 4.2 10*3/uL (ref 1.7–7.7)
Neutrophils Relative %: 54 %
Platelets: 214 10*3/uL (ref 150–400)
RBC: 4.22 MIL/uL (ref 3.87–5.11)
RDW: 12.5 % (ref 11.5–15.5)
WBC: 7.8 10*3/uL (ref 4.0–10.5)
nRBC: 0 % (ref 0.0–0.2)

## 2019-07-15 LAB — COMPREHENSIVE METABOLIC PANEL
ALT: 30 U/L (ref 0–44)
AST: 36 U/L (ref 15–41)
Albumin: 4.2 g/dL (ref 3.5–5.0)
Alkaline Phosphatase: 68 U/L (ref 38–126)
Anion gap: 11 (ref 5–15)
BUN: 15 mg/dL (ref 8–23)
CO2: 25 mmol/L (ref 22–32)
Calcium: 9.2 mg/dL (ref 8.9–10.3)
Chloride: 103 mmol/L (ref 98–111)
Creatinine, Ser: 0.82 mg/dL (ref 0.44–1.00)
GFR calc Af Amer: >60 mL/min (ref >60)
GFR calc non Af Amer: >60 mL/min (ref >60)
Glucose, Bld: 152 mg/dL — ABNORMAL HIGH (ref 70–99)
Potassium: 5 mmol/L (ref 3.5–5.1)
Sodium: 139 mmol/L (ref 135–145)
Total Bilirubin: 1.4 mg/dL — ABNORMAL HIGH (ref 0.3–1.2)
Total Protein: 7.2 g/dL (ref 6.5–8.1)

## 2019-07-15 LAB — LACTIC ACID, PLASMA
Lactic Acid, Venous: 2.1 mmol/L (ref 0.5–1.9)
Lactic Acid, Venous: 1.8 mmol/L (ref 0.5–1.9)

## 2019-07-15 LAB — LIPASE, BLOOD: Lipase: 41 U/L (ref 11–51)

## 2019-07-15 MED ORDER — IOHEXOL 300 MG/ML  SOLN
100.0000 mL | Freq: Once | INTRAMUSCULAR | Status: AC | PRN
  Administered 2019-07-15: 100 mL via INTRAVENOUS

## 2019-07-15 MED ORDER — AMLODIPINE BESYLATE 5 MG PO TABS
5.0000 mg | ORAL_TABLET | Freq: Once | ORAL | Status: AC
  Administered 2019-07-15: 5 mg via ORAL
  Filled 2019-07-15: qty 1

## 2019-07-15 MED ORDER — SODIUM CHLORIDE 0.9 % IV BOLUS
500.0000 mL | Freq: Once | INTRAVENOUS | Status: AC
  Administered 2019-07-15: 500 mL via INTRAVENOUS

## 2019-07-15 MED ORDER — ENALAPRIL MALEATE 10 MG PO TABS
20.0000 mg | ORAL_TABLET | Freq: Once | ORAL | Status: AC
  Administered 2019-07-15: 20 mg via ORAL
  Filled 2019-07-15: qty 2

## 2019-07-15 NOTE — ED Triage Notes (Signed)
Pt to ED via [facility] from [facility]. Per EMS pt c/o abdominal pain x2 days and constipation for 3 days prior. Pt given milk of mag at facility. Per EMS pain had subsided but woke pt up last night. Per EMS pt has had appendix removed. EMS stated abdominal pain had resided once they arrived.   Upon arrival pt c/o abdominal pain in LUQ. Pt oriented to self but states she does not know why they brought her here.

## 2019-07-15 NOTE — ED Notes (Signed)
This RN and Student RN assisted pt to bathroom. Hat placed in toilet to collect urine sample. Pt unable to pee in hat and urine sample not obtained.

## 2019-07-15 NOTE — Progress Notes (Signed)
Ch visited with Pt at the ED while she was waiting to be picked up to go back to her facility. Ch saw Pt was walking in the ED asking for what is going on with her. Ch engaged with Pt until she calmed down. Pt has dementia, and was repeating herself. Ch left after Pt had calmed down considerably to attend to another PG.    07/15/19 1500  Clinical Encounter Type  Visited With Patient  Visit Type Initial;Psychological support;Social support  Referral From Other (Comment)  Consult/Referral To Other (Comment)  Spiritual Encounters  Spiritual Needs Emotional  Stress Factors  Patient Stress Factors Health changes;Loss of control;Major life changes

## 2019-07-15 NOTE — Discharge Instructions (Addendum)

## 2019-07-15 NOTE — ED Notes (Signed)
Pt transported to CT ?

## 2019-07-15 NOTE — ED Provider Notes (Signed)
[facility] Emergency Department Provider Note    First MD Initiated Contact with Patient 07/15/19 [phone]     (approximate)  I have reviewed the triage vital signs and the nursing notes.   HISTORY  Chief Complaint Abdominal Pain  Level V Caveat:  Dementia  HPI VERSIA DELAGUILA is a 86 y.o. female with a history of dementia presents from facility with chief complaint of left-sided abdominal pain starting last night.  Ambers reports that she was pain-free by the time they went to see her.  She is brought in she does seem confused and cannot recall what brought her to the ER but she does have tenderness palpation of the left lower quadrant.  She is very poor historian but denies any sort of chest pain or shortness of breath.  No reported fevers or dysuria.    Past Medical History:  Diagnosis Date  . Diabetes mellitus ([place])    diet controlled  . Diabetes mellitus without complication ([place])   . Dry eye syndrome   . Hyperlipidemia, unspecified   . Hypertension   . Inflammatory arthritis   . Osteoporosis   . Postmenopausal    Family History  Problem Relation Age of Onset  . Alzheimer's disease Mother   . Hypertension Father   . Alzheimer's disease Sister   . Alzheimer's disease Sister    Past Surgical History:  Procedure Laterality Date  . ABDOMINAL HYSTERECTOMY    . APPENDECTOMY    . CAPSULOTOMY     OS  . CATARACT EXTRACTION W/PHACO Right 08/2008   crystalens 24.0D  . CATARACT EXTRACTION [facility] Left 01/18/2009   Crystalens 24.5D   Patient Active Problem List   Diagnosis Date Noted  . Non-insulin dependent type 2 diabetes mellitus ([place]) 01/25/2018  . Dyslipidemia associated with type 2 diabetes mellitus ([place]) 01/25/2018  . Vitamin D deficiency 01/25/2018  . Alzheimer's disease with late onset (CODE) ([place]) 01/14/2017  . Essential (primary) hypertension 01/14/2017  . Hypothyroidism 01/14/2017  . Hypokalemia 01/14/2017  . Anxiety disorder  01/14/2017  . Osteoarthritis 01/14/2017      Prior to Admission medications   Medication Sig Start Date End Date Taking? Authorizing Provider  acetaminophen (TYLENOL ARTHRITIS PAIN) 650 MG CR tablet Take 1,300 mg by mouth 2 (two) times daily.    Yes [provider]  atorvastatin (LIPITOR) 10 MG tablet Take 10 mg by mouth daily.   Yes [provider]  dexamethasone (DECADRON) 0.5 MG/5ML solution Take 0.5 mg by mouth See admin instructions. Rinse 48ml orally for 1 minute and spit out 4 times daily   Yes [provider]  enalapril (VASOTEC) 20 MG tablet Take 20 mg by mouth 2 (two) times daily.    Yes [provider]  guaiFENesin (ROBITUSSIN) 100 MG/5ML liquid Take 200 mg by mouth 3 (three) times daily as needed for cough.   Yes [provider]  levothyroxine (SYNTHROID, LEVOTHROID) 50 MCG tablet Take 50 mcg by mouth daily before breakfast.    Yes [provider]  magnesium hydroxide (MILK OF MAGNESIA) 400 MG/5ML suspension Take 30 mLs by mouth daily as needed for moderate constipation.    Yes [provider]  metFORMIN (GLUCOPHAGE) 500 MG tablet Take 500 mg by mouth 2 (two) times daily with a meal.    Yes [provider]  potassium chloride (K-DUR,KLOR-CON) 10 MEQ tablet Take 20 mEq by mouth daily.    Yes [provider]  rivastigmine (EXELON) 9.5 mg/24hr Place 9.5 mg  onto the skin daily. 0745   Yes [provider]    Allergies Epinephrine, Latex, Levofloxacin, and Other    Social History Social History   Tobacco Use  . Smoking status: Former Smoker    Types: Cigarettes    Quit date: 05/26/1968    Years since quitting: 51.1  . Smokeless tobacco: Never Used  Substance Use Topics  . Alcohol use: No  . Drug use: No    Review of Systems Patient denies headaches, rhinorrhea, blurry vision, numbness, shortness of breath, chest pain, edema, cough, abdominal pain, nausea, vomiting, diarrhea, dysuria,  fevers, rashes or hallucinations unless otherwise stated above in HPI. ____________________________________________   PHYSICAL EXAM:  VITAL SIGNS: Vitals:   07/15/19 1200 07/15/19 1354  BP: (!) 179/91 (!) 162/85  Pulse: (!) 101 90  Resp: (!) 38 20  Temp:    SpO2: 100% 100%    Constitutional: Alert and oriented.  Eyes: Conjunctivae are normal.  Head: Atraumatic. Nose: No congestion/rhinnorhea. Mouth/Throat: Mucous membranes are moist.   Neck: No stridor. Painless ROM.  Cardiovascular: Normal rate, regular rhythm. Grossly normal heart sounds.  Good peripheral circulation. Respiratory: Normal respiratory effort.  No retractions. Lungs CTAB. Gastrointestinal: Soft but with mild ttp in llq. No distention. No abdominal bruits. No CVA tenderness. Genitourinary:  Musculoskeletal: No lower extremity tenderness nor edema.  No joint effusions. Neurologic:  Normal speech and language. No gross focal neurologic deficits are appreciated. No facial droop Skin:  Skin is warm, dry and intact. No rash noted. Psychiatric: Mood and affect are normal. Speech and behavior are normal.  ____________________________________________   LABS (all labs ordered are listed, but only abnormal results are displayed)  Results for orders placed or performed during the hospital encounter of 07/15/19 (from the past 24 hour(s))  Lipase, blood     Status: None   Collection Time: 07/15/19  9:46 AM  Result Value Ref Range   Lipase 41 11 - 51 U/L  Comprehensive metabolic panel     Status: Abnormal   Collection Time: 07/15/19  9:46 AM  Result Value Ref Range   Sodium 139 135 - 145 mmol/L   Potassium 5.0 3.5 - 5.1 mmol/L   Chloride 103 98 - 111 mmol/L   CO2 25 22 - 32 mmol/L   Glucose, Bld 152 (H) 70 - 99 mg/dL   BUN 15 8 - 23 mg/dL   Creatinine, Ser 0.82 0.44 - 1.00 mg/dL   Calcium 9.2 8.9 - 10.3 mg/dL   Total Protein 7.2 6.5 - 8.1 g/dL   Albumin 4.2 3.5 - 5.0 g/dL   AST 36 15 - 41 U/L   ALT 30 0 - 44  U/L   Alkaline Phosphatase 68 38 - 126 U/L   Total Bilirubin 1.4 (H) 0.3 - 1.2 mg/dL   GFR calc non Af Amer >60 >60 mL/min   GFR calc Af Amer >60 >60 mL/min   Anion gap 11 5 - 15  CBC with Differential/Platelet     Status: None   Collection Time: 07/15/19  9:46 AM  Result Value Ref Range   WBC 7.8 4.0 - 10.5 K/uL   RBC 4.22 3.87 - 5.11 MIL/uL   Hemoglobin 12.5 12.0 - 15.0 g/dL   HCT 38.4 36.0 - 46.0 %   MCV 91.0 80.0 - 100.0 fL   MCH 29.6 26.0 - 34.0 pg   MCHC 32.6 30.0 - 36.0 g/dL   RDW 12.5 11.5 - 15.5 %   Platelets 214 150 - 400 K/uL  nRBC 0.0 0.0 - 0.2 %   Neutrophils Relative % 54 %   Neutro Abs 4.2 1.7 - 7.7 K/uL   Lymphocytes Relative 36 %   Lymphs Abs 2.8 0.7 - 4.0 K/uL   Monocytes Relative 7 %   Monocytes Absolute 0.5 0.1 - 1.0 K/uL   Eosinophils Relative 3 %   Eosinophils Absolute 0.2 0.0 - 0.5 K/uL   Basophils Relative 0 %   Basophils Absolute 0.0 0.0 - 0.1 K/uL   Immature Granulocytes 0 %   Abs Immature Granulocytes 0.02 0.00 - 0.07 K/uL  Urinalysis, Complete w Microscopic     Status: Abnormal   Collection Time: 07/15/19 10:12 AM  Result Value Ref Range   Color, Urine STRAW (A) YELLOW   APPearance CLEAR (A) CLEAR   Specific Gravity, Urine 1.020 1.005 - 1.030   pH 7.0 5.0 - 8.0   Glucose, UA NEGATIVE NEGATIVE mg/dL   Hgb urine dipstick SMALL (A) NEGATIVE   Bilirubin Urine NEGATIVE NEGATIVE   Ketones, ur NEGATIVE NEGATIVE mg/dL   Protein, ur NEGATIVE NEGATIVE mg/dL   Nitrite NEGATIVE NEGATIVE   Leukocytes,Ua NEGATIVE NEGATIVE   RBC / HPF 0-5 0 - 5 RBC/hpf   WBC, UA 0-5 0 - 5 WBC/hpf   Bacteria, UA NONE SEEN NONE SEEN   Squamous Epithelial / LPF NONE SEEN 0 - 5  Lactic acid, plasma     Status: Abnormal   Collection Time: 07/15/19 10:12 AM  Result Value Ref Range   Lactic Acid, Venous 2.1 (HH) 0.5 - 1.9 mmol/L  Lactic acid, plasma     Status: None   Collection Time: 07/15/19  1:00 PM  Result Value Ref Range   Lactic Acid, Venous 1.8 0.5 - 1.9 mmol/L    ____________________________________________  EKG My review and personal interpretation at Time: 9:38   Indication: abdominal pain  Rate: 80  Rhythm: sinus Axis: normal Other: normal intervals, no stemi ____________________________________________  RADIOLOGY  I personally reviewed all radiographic images ordered to evaluate for the above acute complaints and reviewed radiology reports and findings.  These findings were personally discussed with the patient.  Please see medical record for radiology report.  ____________________________________________   PROCEDURES  Procedure(s) performed:  Procedures    Critical Care performed: no ____________________________________________   INITIAL IMPRESSION / ASSESSMENT AND PLAN / ED COURSE  Pertinent labs & imaging results that were available during my care of the patient were reviewed by me and considered in my medical decision making (see chart for details).   DDX: colitis, constipation, pyelo, stone, mesenteric ischemia  KAESHA RISSER is a 86 y.o. who presents to the ED with symptoms as described above.  Patient comfortable and asymptomatic at this time.  Mildly hypertension.  Will order her home meds.  Will order imaging for the but differential.  The patient will be placed on continuous pulse oximetry and telemetry for monitoring.  Laboratory evaluation will be sent to evaluate for the above complaints.     Clinical Course as of Jul 14 1401  Fri Jul 15, 2019  1130 Lactate is borderline elevated but the remainder blood work is reassuring.  CT imaging is reassuring.  Will give bolus of IV fluids and repeat.  We will give her home antihypertensive medication and reassess.  She remains in no acute distress.   [PR]    Clinical Course User Index [PR] Merlyn Lot, MD    The patient was evaluated in Emergency Department today for the symptoms described in the  history of present illness. He/she was evaluated in the context of  the global COVID-19 pandemic, which necessitated consideration that the patient might be at risk for infection with the SARS-CoV-2 virus that causes COVID-19. Institutional protocols and algorithms that pertain to the evaluation of patients at risk for COVID-19 are in a state of rapid change based on information released by regulatory bodies including the CDC and federal and state organizations. These policies and algorithms were followed during the patient's care in the ED.  As part of my medical decision making, I reviewed the following data within the [facility] notes reviewed and incorporated, Labs reviewed, notes from prior ED visits and [place] Controlled Substance Database   ____________________________________________   FINAL CLINICAL IMPRESSION(S) / ED DIAGNOSES  Final diagnoses:  Abdominal pain, unspecified abdominal location      NEW MEDICATIONS STARTED DURING THIS VISIT:  New Prescriptions   No medications on file     Note:  This document was prepared using Dragon voice recognition software and may include unintentional dictation errors.    Merlyn Lot, MD 07/15/19 [phone]

## 2019-07-15 NOTE — ED Notes (Signed)
MD at bedside. 

## 2019-07-15 NOTE — ED Notes (Signed)
This RN explained to pt that since we were unable to obtain urine with bedside commode, another option is to in and out cath. This RN explained process. Pt refusing, stating, "You are not going to do that to me." This RN assured pt we will try to use the bedside commode again.

## 2019-07-15 NOTE — ED Notes (Signed)
Date and time results received: 07/15/19 11:28 AM  (use smartphrase ".now" to insert current time)  Test: Lactic  Critical Value: 2.1  Name of Provider Notified: Dr. Quentin Cornwall   Orders Received? Or Actions Taken?: No new orders at this time

## 2019-07-15 NOTE — ED Notes (Signed)
This RN on phone w/ pt's daughter for update.

## 2019-07-15 NOTE — ED Notes (Signed)
Pt repeatedly stating, "I am mad as hell. I don't know why they sent me here. I did not consent to this. 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

## 2019-07-15 NOTE — ED Notes (Signed)
Pt unable to sign transfer form / dischrage form due to dementia.

## 2019-09-26 ENCOUNTER — Encounter
Admission: RE | Admit: 2019-09-26 | Discharge: 2019-09-26 | Disposition: A | Discharge status: Home / Self Care | Payer: Medicare Other | Source: Ambulatory Visit | Attending: Internal Medicine | Admitting: Internal Medicine

## 2019-11-03 ENCOUNTER — Other Ambulatory Visit: Payer: Self-pay | Admitting: Anatomic Pathology & Clinical Pathology

## 2019-11-07 LAB — SURGICAL PATHOLOGY

## 2019-11-10 ENCOUNTER — Other Ambulatory Visit: Payer: Medicare Other

## 2019-11-10 DIAGNOSIS — C069 Malignant neoplasm of mouth, unspecified: Secondary | ICD-10-CM | POA: Insufficient documentation

## 2019-11-10 NOTE — Progress Notes (Signed)
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  INTERVAL HISTORY: Patient is an 86 year old female who was noted to have an ulcerated lesion on her right buccal membrane and subsequent biopsy revealed squamous cell carcinoma.  Patient reports the lesion has been there for several months before she mentioned it to her daughters.  Patient has a difficult time with her memory and much of the history is given by her 2 daughters.  She otherwise feels well.  She has no neurologic complaints.  She denies any recent fevers or illnesses.  She has a fair appetite, but denies weight loss.  She has no chest pain, shortness of breath, cough, or hemoptysis.  She denies any nausea, vomiting, constipation, or diarrhea.  She has no urinary complaints.  Patient otherwise feels well and offers no further specific complaints today.  REVIEW OF SYSTEMS:   Review of Systems  Constitutional: Negative.  Negative for fever, malaise/fatigue and weight loss.  Respiratory: Negative.  Negative for cough, hemoptysis and shortness of breath.   Cardiovascular: Negative.  Negative for chest pain and leg swelling.  Gastrointestinal: Negative.  Negative for abdominal pain, blood in stool and melena.  Genitourinary: Negative.  Negative for hematuria.  Musculoskeletal: Negative.   Skin: Negative.  Negative for rash.  Neurological: Negative.  Negative for dizziness, focal weakness, weakness and headaches.  Psychiatric/Behavioral: Positive for memory loss. The patient is not nervous/anxious.     As per HPI. Otherwise, a complete  review of systems is negative.  PAST MEDICAL HISTORY: Past Medical History:  Diagnosis Date   Diabetes mellitus ([place])    diet controlled   Diabetes mellitus without complication (H[facility])    Dry eye syndrome    Hyperlipidemia, unspecified    Hypertension    Inflammatory arthritis    Osteoporosis    Postmenopausal     PAST SURGICAL HISTORY: Past Surgical History:  Procedure Laterality Date   ABDOMINAL HYSTERECTOMY     APPENDECTOMY     CAPSULOTOMY     OS   CATARACT EXTRACTION W/PHACO Right 08/2008   crystalens 24.0D   CATARACT EXTRACTION W/PHACO Left 01/18/2009   [facility]rystal[facility] 24.5D    FAMILY HISTORY: Family History  Problem Relation Age of Onset   Alzheimer's disease Mother    Hypertension Father    Alzheimer's disease Sister    Alzheimer's disease Sister     ADVANCED DIRECTIVES (Y/N):  N  HEALTH MAINTENANCE: Social History   Tobacco Use   Smoking status: Former Smoker    Types: Cigarettes    Quit date: 05/26/1968    Years since quitting: 51.5   Smokeless tobacco: Never Used  Vaping Use   Vaping Use: Never used  Substance Use Topics   Alcohol use: No   Drug use: No     Colonoscopy:  PAP:  Bone density:  Lipid panel:  Allergies  Allergen Reactions   Epinephrine Other (See Comments)    Other Reaction: Passed out after dental inject   Latex Other (See Comments)    Other Reaction: angioedema, mouth burns   Levofloxacin     Other reaction(s): Hallucination   Other Other (See Comments)    Uncoded Allergy. Allergen:  ENVIRONMENTAL ALLERGIES    Current Outpatient Medications  Medication Sig Dispense Refill   acetaminophen (TYLENOL ARTHRITIS PAIN) 650 MG CR tablet Take 1,300 mg by mouth 2 (two) times daily.      atorvastatin (LIPITOR) 10 MG tablet Take 10 mg by mouth daily.     azithromycin (ZITHROMAX) 250 MG tablet Take 250 mg by mouth as directed.     dexamethasone (DECADRON) 0.5 MG/5ML solution Take 0.5 mg by mouth See admin  instructions. Rinse 47ml orally for 1 minute and spit out 4 times daily     dexamethasone 0.5 MG/5ML elixir Take 5 mLs by mouth in the morning, at noon, in the evening, and at bedtime.     enalapril (VASOTEC) 20 MG tablet Take 20 mg by mouth 2 (two) times daily.      levothyroxine (SYNTHROID, LEVOTHROID) 50 MCG tablet Take 50 mcg by mouth daily before breakfast.      magnesium hydroxide (MILK OF MAGNESIA) 400 MG/5ML suspension Take 30 mLs by mouth daily as needed for moderate constipation.      metFORMIN (GLUCOPHAGE) 500 MG tablet Take 500 mg by mouth 2 (two) times daily with a meal.      potassium chloride (K-DUR,KLOR-CON) 10 MEQ tablet Take 20 mEq by mouth daily.      rivastigmine (EXELON) 9.5 mg/24hr Place 9.5 mg onto the skin daily. 0745     traMADol (ULTRAM) 50 MG tablet Take 50 mg by mouth 4 (four) times daily as needed.     guaiFENesin (ROBITUSSIN) 100 MG/5ML liquid Take 200 mg by mouth 3 (three) times daily as needed for cough. (Patient not taking: Reported on 11/14/2019)     No current facility-administered medications for this visit.    OBJECTIVE: Vitals:   11/14/19 1344  BP: (!) 156/86  Pulse: 90  Resp: 20  Temp: 98 F (36.7 C)  SpO2: 96%     Body mass index is 23.95 kg/m.    ECOG FS:1 - Symptomatic but completely ambulatory  General: Well-developed, well-nourished, no acute distress. Eyes: Pink conjunctiva, anicteric sclera. HEENT: Normocephalic, moist mucous membranes.  Ulcerated lesion noted on the right buccal membrane.  No palpable lymphadenopathy. Lungs: No audible wheezing or coughing. Heart: Regular rate and rhythm. Abdomen: Soft, nontender, no obvious distention. Musculoskeletal: No edema, cyanosis, or clubbing. Neuro: Alert, answering all questions appropriately. Cranial nerves grossly intact. Skin: No rashes or petechiae noted. Psych: Normal affect. Lymphatics: No cervical, calvicular, axillary or inguinal LAD.   LAB RESULTS:  Lab Results    Component Value Date   NA 139 07/15/2019   K 5.0 07/15/2019   CL 103 07/15/2019   CO2 25 07/15/2019   GLUCOSE 152 (H) 07/15/2019   BUN 15 07/15/2019   CREATININE 0.82 07/15/2019   CALCIUM 9.2 07/15/2019   PROT 7.2 07/15/2019   ALBUMIN 4.2 07/15/2019   AST 36 07/15/2019   ALT 30 07/15/2019   ALKPHOS 68 07/15/2019   BILITOT 1.4 (H) 07/15/2019   GFRNONAA >60 07/15/2019   GFRAA >60 07/15/2019    Lab Results  Component Value Date   WBC 7.8 07/15/2019   NEUTROABS 4.2 07/15/2019   HGB 12.5 07/15/2019   HCT 38.4 07/15/2019   MCV 91.0 07/15/2019   PLT 214 07/15/2019     STUDIES: No results found.  ASSESSMENT: Squamous cell carcinoma of oral cavity.  PLAN:    1. Squamous cell carcinoma of oral cavity: Pathology results reviewed independently.  Patient will require PET scan for further evaluation and to assess  the extent of the lesion.  Given her advanced age and decreased memory, chemotherapy likely is not a viable treatment option.  Patient may benefit from XRT.  Return to clinic 1 to 2 days after her PET scan for further evaluation.  Patient will also have consultation with radiation oncology on that day.  I spent a total of 60 minutes reviewing chart data, face-to-face evaluation with the patient, counseling and coordination of care as detailed above.   Patient expressed understanding and was in agreement with this plan. She also understands that She can call clinic at any time with any questions, concerns, or complaints.   Cancer Staging No matching staging information was found for the patient.  Lloyd Huger, MD   11/14/2019 2:58 PM

## 2019-11-10 NOTE — Progress Notes (Signed)
Tumor Board Documentation  Katie Logan was presented by Dr Grayland Ormond at our Tumor Board on 11/10/2019, which included representatives from medical oncology, radiation oncology, surgical oncology, internal medicine, navigation, pathology, radiology, surgical, pharmacy, genetics, research, pulmonology.  Katie Logan currently presents as a new patient, for Katie Logan, for new positive pathology with history of the following treatments: active survellience, surgical intervention(s).  Additionally, we reviewed previous medical and familial history, history of present illness, and recent lab results along with all available histopathologic and imaging studies. The tumor board considered available treatment options and made the following recommendations: Radiation therapy (primary modality), Additional screening (PET Scan)    The following procedures/referrals were also placed: No orders of the defined types were placed in this encounter.   Clinical Trial Status: not discussed   Staging used: To be determined  AJCC Staging:       Group: Squamous Cell Carcinoma of hte Oral Mucosa, right Buccal   National site-specific guidelines   were discussed with respect to the case.  Tumor board is a meeting of clinicians from various specialty areas who evaluate and discuss patients for whom a multidisciplinary approach is being considered. Final determinations in the plan of care are those of the provider(s). The responsibility for follow up of recommendations given during tumor board is that of the provider.   Today's extended care, comprehensive team conference, Katie Logan was not present for the discussion and was not examined.   Multidisciplinary Tumor Board is a multidisciplinary case peer review process.  Decisions discussed in the Multidisciplinary Tumor Board reflect the opinions of the specialists present at the conference without having examined the patient.  Ultimately, treatment and diagnostic decisions  rest with the primary provider(s) and the patient.

## 2019-11-14 ENCOUNTER — Inpatient Hospital Stay: Payer: Medicare Other | Attending: Oncology | Admitting: Oncology

## 2019-11-14 ENCOUNTER — Other Ambulatory Visit: Payer: Self-pay

## 2019-11-14 ENCOUNTER — Encounter: Payer: Self-pay | Admitting: Oncology

## 2019-11-14 ENCOUNTER — Inpatient Hospital Stay: Payer: Medicare Other

## 2019-11-14 DIAGNOSIS — C069 Malignant neoplasm of mouth, unspecified: Secondary | ICD-10-CM | POA: Diagnosis present

## 2019-11-14 DIAGNOSIS — E785 Hyperlipidemia, unspecified: Secondary | ICD-10-CM | POA: Diagnosis not present

## 2019-11-14 DIAGNOSIS — Z7984 Long term (current) use of oral hypoglycemic drugs: Secondary | ICD-10-CM | POA: Diagnosis not present

## 2019-11-14 DIAGNOSIS — E119 Type 2 diabetes mellitus without complications: Secondary | ICD-10-CM | POA: Insufficient documentation

## 2019-11-14 DIAGNOSIS — Z7952 Long term (current) use of systemic steroids: Secondary | ICD-10-CM | POA: Diagnosis not present

## 2019-11-14 DIAGNOSIS — I10 Essential (primary) hypertension: Secondary | ICD-10-CM | POA: Insufficient documentation

## 2019-11-14 DIAGNOSIS — Z87891 Personal history of nicotine dependence: Secondary | ICD-10-CM | POA: Insufficient documentation

## 2019-11-14 DIAGNOSIS — Z79899 Other long term (current) drug therapy: Secondary | ICD-10-CM | POA: Diagnosis not present

## 2019-11-15 ENCOUNTER — Telehealth: Payer: Self-pay | Admitting: *Deleted

## 2019-11-15 ENCOUNTER — Other Ambulatory Visit: Payer: Self-pay | Admitting: Emergency Medicine

## 2019-11-15 NOTE — Telephone Encounter (Signed)
Are we talking viscous lidocaine?

## 2019-11-15 NOTE — Telephone Encounter (Signed)
Yes

## 2019-11-15 NOTE — Telephone Encounter (Signed)
Yes, I forgot to send.  Sorry!

## 2019-11-15 NOTE — Progress Notes (Signed)
error 

## 2019-11-15 NOTE — Telephone Encounter (Signed)
RX faxed

## 2019-11-15 NOTE — Telephone Encounter (Signed)
Per Vicky at [facility], patient was to have prescription for Lidocaine, but prescription not received. Requesting that prescription be faxed to [place] fax [phone] so that they can get medicine for patient.

## 2019-11-17 NOTE — Progress Notes (Signed)
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  INTERVAL HISTORY: Patient returns to clinic today for further evaluation and discussion of her PET scan results.  She does not complain of dysphagia.  She has no neurologic complaints.  She denies any recent fevers or illnesses.  She has a fair appetite, but denies weight loss.  She has no chest pain, shortness of breath, cough, or hemoptysis.  She denies any nausea, vomiting, constipation, or diarrhea.  She has no urinary complaints.  Patient offers no further specific complaints today.  REVIEW OF SYSTEMS:   Review of Systems  Constitutional: Negative.  Negative for fever, malaise/fatigue and weight loss.  Respiratory: Negative.  Negative for cough, hemoptysis and shortness of breath.   Cardiovascular: Negative.  Negative for chest pain and leg swelling.  Gastrointestinal: Negative.  Negative for abdominal pain, blood in stool and melena.  Genitourinary: Negative.  Negative for hematuria.  Musculoskeletal: Negative.   Skin: Negative.  Negative for rash.  Neurological: Negative.  Negative for dizziness, focal weakness, weakness and headaches.  Psychiatric/Behavioral: Positive for memory loss. The patient is not nervous/anxious.     As per HPI. Otherwise, a complete review of systems is negative.  PAST MEDICAL HISTORY: Past Medical History:  Diagnosis Date  . Diabetes mellitus ([place])    diet controlled  . Diabetes mellitus without complication ([place])   . Dry eye syndrome     . Hyperlipidemia, unspecified   . Hypertension   . Inflammatory arthritis   . Osteoporosis   . Postmenopausal     PAST SURGICAL HISTORY: Past Surgical History:  Procedure Laterality Date  . ABDOMINAL HYSTERECTOMY    . APPENDECTOMY    . CAPSULOTOMY     OS  . CATARACT EXTRACTION W/PHACO Right 08/2008   crystalens 24.0D  . CATARACT EXTRACTION W/PHACO Left 01/18/2009   [place]rystalens 24.5D    FAMILY HISTORY: Family History  Problem Relation Age of Onset  . Alzheimer's disease Mother   . Hypertension Father   . Alzheimer's disease Sister   . Alzheimer's disease Sister     ADVANCED DIRECTIVES (Y/N):  N  HEALTH MAINTENANCE: Social History   Tobacco Use  . Smoking status: Former Smoker    Types: Cigarettes    Quit date: 05/26/1968    Years since quitting: 51.5  . Smokeless tobacco: Never Used  Vaping Use  . Vaping Use: Never used  Substance Use Topics  . Alcohol use: No  . Drug use: No     Colonoscopy:  PAP:  Bone density:  Lipid panel:  Allergies  Allergen Reactions  . Epinephrine Other (See Comments)    Other Reaction: Passed out after dental inject  . Latex Other (See Comments)    Other Reaction: angioedema, mouth burns  . Levofloxacin     Other reaction(s): Hallucination  . Other Other (See Comments)    Uncoded Allergy. Allergen: ENVIRONMENTAL ALLERGIES    Current Outpatient Medications  Medication Sig Dispense Refill  . acetaminophen (TYLENOL ARTHRITIS PAIN) 650 MG CR tablet Take 1,300 mg by mouth 2 (two) times daily.     Marland Kitchen atorvastatin (LIPITOR) 10  MG tablet Take 10 mg by mouth daily.    Marland Kitchen azithromycin (ZITHROMAX) 250 MG tablet Take 250 mg by mouth as directed.    Marland Kitchen dexamethasone (DECADRON) 0.5 MG/5ML solution Take 0.5 mg by mouth See admin instructions. Rinse 6ml orally for 1 minute and spit out 4 times daily    . dexamethasone 0.5 MG/5ML elixir Take 5 mLs by mouth in the morning, at noon, in the evening, and at bedtime.    . enalapril (VASOTEC) 20 MG  tablet Take 20 mg by mouth 2 (two) times daily.     Marland Kitchen guaiFENesin (ROBITUSSIN) 100 MG/5ML liquid Take 200 mg by mouth 3 (three) times daily as needed for cough.     . levothyroxine (SYNTHROID, LEVOTHROID) 50 MCG tablet Take 50 mcg by mouth daily before breakfast.     . lidocaine (XYLOCAINE) 2 % solution Use as directed 15 mLs in the mouth or throat as directed.     . magnesium hydroxide (MILK OF MAGNESIA) 400 MG/5ML suspension Take 30 mLs by mouth daily as needed for moderate constipation.     . metFORMIN (GLUCOPHAGE) 500 MG tablet Take 500 mg by mouth 2 (two) times daily with a meal.     . potassium chloride (K-DUR,KLOR-CON) 10 MEQ tablet Take 20 mEq by mouth daily.     . rivastigmine (EXELON) 9.5 mg/24hr Place 9.5 mg onto the skin daily. 0745    . traMADol (ULTRAM) 50 MG tablet Take 50 mg by mouth 4 (four) times daily as needed.     No current facility-administered medications for this visit.    OBJECTIVE: Vitals:   11/23/19 1018  BP: (!) 153/75  Pulse: 95  Temp: 97.9 F (36.6 C)  SpO2: 97%     Body mass index is 24.23 kg/m.    ECOG FS:1 - Symptomatic but completely ambulatory  General: Well-developed, well-nourished, no acute distress. Eyes: Pink conjunctiva, anicteric sclera. HEENT: Normocephalic, moist mucous membranes. Ulcerated lesion noted on the right buccal membrane.  No palpable lymphadenopathy. Lungs: No audible wheezing or coughing. Heart: Regular rate and rhythm. Abdomen: Soft, nontender, no obvious distention. Musculoskeletal: No edema, cyanosis, or clubbing. Neuro: Alert, answering all questions appropriately. Cranial nerves grossly intact. Skin: No rashes or petechiae noted. Psych: Normal affect.  LAB RESULTS:  Lab Results  Component Value Date   NA 139 07/15/2019   K 5.0 07/15/2019   CL 103 07/15/2019   CO2 25 07/15/2019   GLUCOSE 152 (H) 07/15/2019   BUN 15 07/15/2019   CREATININE 0.82 07/15/2019   CALCIUM 9.2 07/15/2019   PROT 7.2 07/15/2019    ALBUMIN 4.2 07/15/2019   AST 36 07/15/2019   ALT 30 07/15/2019   ALKPHOS 68 07/15/2019   BILITOT 1.4 (H) 07/15/2019   GFRNONAA >60 07/15/2019   GFRAA >60 07/15/2019    Lab Results  Component Value Date   WBC 7.8 07/15/2019   NEUTROABS 4.2 07/15/2019   HGB 12.5 07/15/2019   HCT 38.4 07/15/2019   MCV 91.0 07/15/2019   PLT 214 07/15/2019     STUDIES: NM PET Image Initial (PI) Skull Base To Thigh  Result Date: 11/22/2019 CLINICAL DATA:  Initial treatment strategy for squamous cell carcinoma of the head and neck. EXAM: NUCLEAR MEDICINE PET SKULL BASE TO THIGH TECHNIQUE: 7.3 mCi F-18 FDG was injected intravenously. Full-ring PET imaging was performed from the skull base to thigh after the radiotracer. CT data was obtained and used for attenuation correction and anatomic localization. Fasting blood glucose: 109 mg/dl COMPARISON:  Abdomen/pelvis CT 07/15/2019. FINDINGS: Mediastinal blood pool activity: SUV max 2.2 Liver activity: SUV max NA NECK: Focal hypermetabolism identified along the body of the right mandible with SUV max = 8. No hypermetabolic cervical lymphadenopathy. Incidental CT findings: 1.8 cm left thyroid nodule is stable since 11/06/2016 and shows no hypermetabolism. No follow-up recommended. (Ref: J Am Coll Radiol. 2015 Feb;12(2): 143-50). CHEST: No hypermetabolic mediastinal or hilar nodes. 1.7 cm left lower lobe pulmonary nodule identified on image 74/3 is stable since CTA chest 11/06/2016 in shows no hypermetabolism on PET imaging. Incidental CT findings: Heart size upper normal to mildly increased. Coronary artery calcification is evident. Atherosclerotic calcification is noted in the wall of the thoracic aorta. Calcified nodal tissue identified in the right hilum. Chronic interstitial changes with subpleural reticulation suggests component of underlying fibrotic lung disease. ABDOMEN/PELVIS: No abnormal hypermetabolic activity within the liver, pancreas, adrenal glands, or spleen.  No hypermetabolic lymph nodes in the abdomen or pelvis. Incidental CT findings: 4.1 cm cyst noted upper pole right kidney. There is abdominal aortic atherosclerosis without aneurysm. Diverticular changes noted left colon. SKELETON: No focal hypermetabolic activity to suggest skeletal metastasis. Incidental CT findings: No worrisome lytic or sclerotic osseous abnormality. IMPRESSION: 1. Focal hypermetabolism identified in the right mouth, along the body of the mandible, compatible with the patient's known primary neoplasm. No evidence for hypermetabolic metastatic disease in the neck, chest, abdomen, or pelvis. 2. 1.7 cm left lower lobe pulmonary nodule is stable since 11/06/2016 in that exam documented only minimal change since 2011. There is no hypermetabolism in this nodule on today's study. Imaging features most consistent with a benign etiology. 3. Chronic interstitial changes in the lungs with subpleural reticulation. Component of underlying fibrotic lung disease a concern. 4.  Aortic Atherosclerois (ICD10-170.0) Electronically Signed   By: Misty Stanley M.D.   On: 11/22/2019 16:18    ASSESSMENT: Squamous cell carcinoma of oral cavity.  PLAN:    1. Squamous cell carcinoma of oral cavity: PET scan results from November 22, 2027 reviewed independently and reported as above with no obvious metastatic disease.  Given her advanced age and decreased memory, do not recommend chemotherapy for a localized lesion.  Patient has an appointment with radiation oncology later this morning for initial consultation and treatment planning.  Patient will follow up at the end of her XRT for further evaluation and discussion of future diagnostic planning.    I spent a total of 30 minutes reviewing chart data, face-to-face evaluation with the patient, counseling and coordination of care as detailed above.   Patient expressed understanding and was in agreement with this plan. She also understands that She can call clinic at  any time with any questions, concerns, or complaints.   Cancer Staging No matching staging information was found for the patient.  Lloyd Huger, MD   11/24/2019 6:59 AM

## 2019-11-21 ENCOUNTER — Institutional Professional Consult (permissible substitution): Payer: Medicare Other | Admitting: Radiation Oncology

## 2019-11-22 ENCOUNTER — Other Ambulatory Visit: Payer: Self-pay

## 2019-11-22 ENCOUNTER — Encounter
Admission: RE | Admit: 2019-11-22 | Discharge: 2019-11-22 | Disposition: A | Discharge status: Home / Self Care | Payer: Medicare Other | Source: Ambulatory Visit | Attending: Oncology | Admitting: Oncology

## 2019-11-22 DIAGNOSIS — I700 Atherosclerosis of aorta: Secondary | ICD-10-CM | POA: Insufficient documentation

## 2019-11-22 DIAGNOSIS — I2510 Atherosclerotic heart disease of native coronary artery without angina pectoris: Secondary | ICD-10-CM | POA: Diagnosis not present

## 2019-11-22 DIAGNOSIS — C069 Malignant neoplasm of mouth, unspecified: Secondary | ICD-10-CM

## 2019-11-22 DIAGNOSIS — R918 Other nonspecific abnormal finding of lung field: Secondary | ICD-10-CM | POA: Insufficient documentation

## 2019-11-22 LAB — GLUCOSE, CAPILLARY: Glucose-Capillary: 109 mg/dL — ABNORMAL HIGH (ref 70–99)

## 2019-11-22 MED ORDER — FLUDEOXYGLUCOSE F - 18 (FDG) INJECTION
7.0000 | Freq: Once | INTRAVENOUS | Status: AC | PRN
  Administered 2019-11-22: 7.26 via INTRAVENOUS

## 2019-11-23 ENCOUNTER — Ambulatory Visit
Admission: RE | Admit: 2019-11-23 | Discharge: 2019-11-23 | Disposition: A | Discharge status: Home / Self Care | Payer: Medicare Other | Source: Ambulatory Visit | Attending: Radiation Oncology | Admitting: Radiation Oncology

## 2019-11-23 ENCOUNTER — Encounter: Payer: Self-pay | Admitting: Oncology

## 2019-11-23 ENCOUNTER — Inpatient Hospital Stay (HOSPITAL_BASED_OUTPATIENT_CLINIC_OR_DEPARTMENT_OTHER): Payer: Medicare Other | Admitting: Oncology

## 2019-11-23 VITALS — BP 153/75 | HR 95 | Temp 97.9°F | Wt 136.8 lb

## 2019-11-23 DIAGNOSIS — C069 Malignant neoplasm of mouth, unspecified: Secondary | ICD-10-CM | POA: Diagnosis not present

## 2019-11-23 DIAGNOSIS — I10 Essential (primary) hypertension: Secondary | ICD-10-CM | POA: Diagnosis not present

## 2019-11-23 DIAGNOSIS — Z87891 Personal history of nicotine dependence: Secondary | ICD-10-CM | POA: Insufficient documentation

## 2019-11-23 DIAGNOSIS — C44329 Squamous cell carcinoma of skin of other parts of face: Secondary | ICD-10-CM | POA: Insufficient documentation

## 2019-11-23 DIAGNOSIS — M810 Age-related osteoporosis without current pathological fracture: Secondary | ICD-10-CM | POA: Insufficient documentation

## 2019-11-23 DIAGNOSIS — E785 Hyperlipidemia, unspecified: Secondary | ICD-10-CM | POA: Diagnosis not present

## 2019-11-23 DIAGNOSIS — Z7984 Long term (current) use of oral hypoglycemic drugs: Secondary | ICD-10-CM | POA: Diagnosis not present

## 2019-11-23 DIAGNOSIS — Z79899 Other long term (current) drug therapy: Secondary | ICD-10-CM | POA: Diagnosis not present

## 2019-11-23 DIAGNOSIS — F0390 Unspecified dementia without behavioral disturbance: Secondary | ICD-10-CM | POA: Diagnosis not present

## 2019-11-23 DIAGNOSIS — E119 Type 2 diabetes mellitus without complications: Secondary | ICD-10-CM | POA: Diagnosis not present

## 2019-11-23 NOTE — Progress Notes (Signed)
Patient is here today with daughter for follow up. Reports lidocaine mouth wash does seem to be helping with mouth. Reports no other issues or concerns at this time.

## 2019-11-23 NOTE — Consult Note (Signed)
NEW PATIENT EVALUATION  Name: Katie Logan  MRN: [id number]  Date:   11/23/2019     DOB: 08-23-1929   This 86 y.o. 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  REFERRING PHYSICIAN: Tracie Harrier, MD  CHIEF COMPLAINT:  Chief Complaint  Patient presents with  . 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   PREVIOUS INVESTIGATIONS:  PET CT scan reviewed Clinical notes reviewed Pathology report reviewed  HPI: Patient is an 86 year old female who presented with a painful ulcerated lesion of the right buccal mucosa.  PET CT scan demonstrated hypermetabolic [place] in the lesion although no evidence of lymph node involvement was noted.  Biopsy was positive for nonkeratinizing squamous cell carcinoma.  Lesion is painful although the patient is able to chew her food without significant problem.  She is having no dysphagia.  She is referred to radiation oncology today for consideration of treatment.  Patient has early dementia.  She is accompanied today by her daughter.  PLANNED TREATMENT REGIMEN: IMRT radiation therapy  PAST MEDICAL HISTORY:  has a past medical history of Diabetes mellitus ([place]), Diabetes mellitus without complication ([place]), Dry eye syndrome, Hyperlipidemia, unspecified, Hypertension, Inflammatory arthritis, Osteoporosis, and Postmenopausal.    PAST SURGICAL HISTORY:  Past Surgical History:  Procedure Laterality Date  . ABDOMINAL HYSTERECTOMY    . APPENDECTOMY    . CAPSULOTOMY     OS  . CATARACT EXTRACTION W/PHACO Right 08/2008   crystalens 24.0D  . CATARACT EXTRACTION W/PHACO Left 01/18/2009   Crystalens 24.5D    FAMILY HISTORY: family history includes Alzheimer's disease in her mother, sister, and sister; Hypertension in her father.  SOCIAL HISTORY:  reports that she quit smoking about  51 years ago. Her smoking use included cigarettes. She has never used smokeless tobacco. She reports that she does not drink alcohol and does not use drugs.  ALLERGIES: Epinephrine, Latex, Levofloxacin, and Other  MEDICATIONS:  Current Outpatient Medications  Medication Sig Dispense Refill  . acetaminophen (TYLENOL ARTHRITIS PAIN) 650 MG CR tablet Take 1,300 mg by mouth 2 (two) times daily.     Marland Kitchen atorvastatin (LIPITOR) 10 MG tablet Take 10 mg by mouth daily.    Marland Kitchen azithromycin (ZITHROMAX) 250 MG tablet Take 250 mg by mouth as directed.    Marland Kitchen dexamethasone (DECADRON) 0.5 MG/5ML solution Take 0.5 mg by mouth See admin instructions. Rinse 6ml orally for 1 minute and spit out 4 times daily    . dexamethasone 0.5 MG/5ML elixir Take 5 mLs by mouth in the morning, at noon, in the evening, and at bedtime.    . enalapril (VASOTEC) 20 MG tablet Take 20 mg by mouth 2 (two) times daily.     Marland Kitchen guaiFENesin (ROBITUSSIN) 100 MG/5ML liquid Take 200 mg by mouth 3 (three) times daily as needed for cough.     . levothyroxine (SYNTHROID, LEVOTHROID) 50 MCG tablet Take 50 mcg by mouth daily before breakfast.     . lidocaine (XYLOCAINE) 2 % solution Use as directed 15 mLs in the mouth or throat as directed.     . magnesium hydroxide (MILK OF MAGNESIA) 400 MG/5ML suspension Take 30 mLs by mouth daily as needed for moderate constipation.     . metFORMIN (GLUCOPHAGE) 500 MG tablet Take 500 mg by mouth 2 (two) times daily with a meal.     .  potassium chloride (K-DUR,KLOR-CON) 10 MEQ tablet Take 20 mEq by mouth daily.     . rivastigmine (EXELON) 9.5 mg/24hr Place 9.5 mg onto the skin daily. 0745    . traMADol (ULTRAM) 50 MG tablet Take 50 mg by mouth 4 (four) times daily as needed.     No current facility-administered medications for this encounter.    ECOG PERFORMANCE STATUS:  1 - Symptomatic but completely ambulatory  REVIEW OF SYSTEMS: Patient denies any weight loss, fatigue, weakness, fever, chills or night  sweats. Patient denies any loss of vision, blurred vision. Patient denies any ringing  of the ears or hearing loss. No irregular heartbeat. Patient denies heart murmur or history of fainting. Patient denies any chest pain or pain radiating to her upper extremities. Patient denies any shortness of breath, difficulty breathing at night, cough or hemoptysis. Patient denies any swelling in the lower legs. Patient denies any nausea vomiting, vomiting of blood, or coffee ground material in the vomitus. Patient denies any stomach pain. Patient states has had normal bowel movements no significant constipation or diarrhea. Patient denies any dysuria, hematuria or significant nocturia. Patient denies any problems walking, swelling in the joints or loss of balance. Patient denies any skin changes, loss of hair or loss of weight. Patient denies any excessive worrying or anxiety or significant depression. Patient denies any problems with insomnia. Patient denies excessive thirst, polyuria, polydipsia. Patient denies any swollen glands, patient denies easy bruising or easy bleeding. Patient denies any recent infections, allergies or URI. Patient "s visual fields have not changed significantly in recent time.   PHYSICAL EXAM: There were no vitals taken for this visit. Ulcerative lesion near the vertex of the mouth on the buccal mucosa.  Teeth are in an excellent state of repair.  No evidence of cervical or supraclavicular adenopathy.  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  PLAN: At this time elect go ahead with IMRT radiation therapy.  We will treat the buccal mucosa and neck nodes in the submental region.  Would treat the lesion to 6400 centigrade and the neck nodes to 5000 cGy using IMRT treatment planning and  delivery.  Risks and benefits of treatment occluding skin reaction possible oral mucositis fatigue possible alteration of taste all were discussed in detail.  We use a bug bite block during our treatment planning.  Patient and daughter comprehend my treatment plan well.  I have personally set up CT simulation.  I would like to take this opportunity to thank you for allowing me to participate in the care of your patient.Noreene Filbert, MD

## 2019-12-01 ENCOUNTER — Ambulatory Visit
Admission: RE | Admit: 2019-12-01 | Discharge: 2019-12-01 | Disposition: A | Discharge status: Home / Self Care | Payer: Medicare Other | Source: Ambulatory Visit | Attending: Radiation Oncology | Admitting: Radiation Oncology

## 2019-12-01 DIAGNOSIS — C069 Malignant neoplasm of mouth, unspecified: Secondary | ICD-10-CM | POA: Diagnosis not present

## 2019-12-06 DIAGNOSIS — C069 Malignant neoplasm of mouth, unspecified: Secondary | ICD-10-CM | POA: Diagnosis not present

## 2019-12-07 ENCOUNTER — Other Ambulatory Visit: Payer: Self-pay | Admitting: *Deleted

## 2019-12-07 DIAGNOSIS — C069 Malignant neoplasm of mouth, unspecified: Secondary | ICD-10-CM

## 2019-12-12 ENCOUNTER — Ambulatory Visit: Admission: RE | Admit: 2019-12-12 | Payer: Medicare Other | Source: Ambulatory Visit

## 2019-12-12 ENCOUNTER — Encounter
Admission: RE | Admit: 2019-12-12 | Discharge: 2019-12-12 | Disposition: A | Discharge status: Home / Self Care | Payer: Medicare Other | Source: Ambulatory Visit | Attending: Internal Medicine | Admitting: Internal Medicine

## 2019-12-13 ENCOUNTER — Ambulatory Visit
Admission: RE | Admit: 2019-12-13 | Discharge: 2019-12-13 | Disposition: A | Discharge status: Home / Self Care | Payer: Medicare Other | Source: Ambulatory Visit | Attending: Radiation Oncology | Admitting: Radiation Oncology

## 2019-12-13 DIAGNOSIS — C069 Malignant neoplasm of mouth, unspecified: Secondary | ICD-10-CM | POA: Diagnosis not present

## 2019-12-14 ENCOUNTER — Ambulatory Visit
Admission: RE | Admit: 2019-12-14 | Discharge: 2019-12-14 | Disposition: A | Discharge status: Home / Self Care | Payer: Medicare Other | Source: Ambulatory Visit | Attending: Radiation Oncology | Admitting: Radiation Oncology

## 2019-12-14 DIAGNOSIS — C069 Malignant neoplasm of mouth, unspecified: Secondary | ICD-10-CM | POA: Diagnosis not present

## 2019-12-15 ENCOUNTER — Ambulatory Visit
Admission: RE | Admit: 2019-12-15 | Discharge: 2019-12-15 | Disposition: A | Discharge status: Home / Self Care | Payer: Medicare Other | Source: Ambulatory Visit | Attending: Radiation Oncology | Admitting: Radiation Oncology

## 2019-12-15 DIAGNOSIS — C069 Malignant neoplasm of mouth, unspecified: Secondary | ICD-10-CM | POA: Diagnosis not present

## 2019-12-16 ENCOUNTER — Ambulatory Visit
Admission: RE | Admit: 2019-12-16 | Discharge: 2019-12-16 | Disposition: A | Discharge status: Home / Self Care | Payer: Medicare Other | Source: Ambulatory Visit | Attending: Radiation Oncology | Admitting: Radiation Oncology

## 2019-12-16 DIAGNOSIS — C069 Malignant neoplasm of mouth, unspecified: Secondary | ICD-10-CM | POA: Diagnosis not present

## 2019-12-19 ENCOUNTER — Ambulatory Visit
Admission: RE | Admit: 2019-12-19 | Discharge: 2019-12-19 | Disposition: A | Discharge status: Home / Self Care | Payer: Medicare Other | Source: Ambulatory Visit | Attending: Radiation Oncology | Admitting: Radiation Oncology

## 2019-12-19 DIAGNOSIS — C069 Malignant neoplasm of mouth, unspecified: Secondary | ICD-10-CM | POA: Diagnosis not present

## 2019-12-20 ENCOUNTER — Other Ambulatory Visit: Payer: Self-pay | Admitting: *Deleted

## 2019-12-20 ENCOUNTER — Ambulatory Visit
Admission: RE | Admit: 2019-12-20 | Discharge: 2019-12-20 | Disposition: A | Discharge status: Home / Self Care | Payer: Medicare Other | Source: Ambulatory Visit | Attending: Radiation Oncology | Admitting: Radiation Oncology

## 2019-12-20 DIAGNOSIS — C069 Malignant neoplasm of mouth, unspecified: Secondary | ICD-10-CM | POA: Diagnosis not present

## 2019-12-20 MED ORDER — SUCRALFATE 1 G PO TABS: 1.0000 g | ORAL_TABLET | Freq: Three times a day (TID) | ORAL | 1 refills | Status: AC

## 2019-12-21 ENCOUNTER — Inpatient Hospital Stay: Payer: Medicare Other

## 2019-12-21 ENCOUNTER — Ambulatory Visit
Admission: RE | Admit: 2019-12-21 | Discharge: 2019-12-21 | Disposition: A | Discharge status: Home / Self Care | Payer: Medicare Other | Source: Ambulatory Visit | Attending: Radiation Oncology | Admitting: Radiation Oncology

## 2019-12-21 DIAGNOSIS — C069 Malignant neoplasm of mouth, unspecified: Secondary | ICD-10-CM | POA: Diagnosis not present

## 2019-12-22 ENCOUNTER — Ambulatory Visit
Admission: RE | Admit: 2019-12-22 | Discharge: 2019-12-22 | Disposition: A | Discharge status: Home / Self Care | Payer: Medicare Other | Source: Ambulatory Visit | Attending: Radiation Oncology | Admitting: Radiation Oncology

## 2019-12-22 DIAGNOSIS — C069 Malignant neoplasm of mouth, unspecified: Secondary | ICD-10-CM | POA: Diagnosis not present

## 2019-12-23 ENCOUNTER — Ambulatory Visit
Admission: RE | Admit: 2019-12-23 | Discharge: 2019-12-23 | Disposition: A | Discharge status: Home / Self Care | Payer: Medicare Other | Source: Ambulatory Visit | Attending: Radiation Oncology | Admitting: Radiation Oncology

## 2019-12-23 DIAGNOSIS — C069 Malignant neoplasm of mouth, unspecified: Secondary | ICD-10-CM | POA: Diagnosis not present

## 2019-12-26 ENCOUNTER — Ambulatory Visit
Admission: RE | Admit: 2019-12-26 | Discharge: 2019-12-26 | Disposition: A | Discharge status: Home / Self Care | Payer: Medicare Other | Source: Ambulatory Visit | Attending: Radiation Oncology | Admitting: Radiation Oncology

## 2019-12-26 DIAGNOSIS — C069 Malignant neoplasm of mouth, unspecified: Secondary | ICD-10-CM | POA: Diagnosis present

## 2019-12-27 ENCOUNTER — Inpatient Hospital Stay: Payer: Medicare Other | Attending: Oncology

## 2019-12-27 ENCOUNTER — Ambulatory Visit
Admission: RE | Admit: 2019-12-27 | Discharge: 2019-12-27 | Disposition: A | Discharge status: Home / Self Care | Payer: Medicare Other | Source: Ambulatory Visit | Attending: Radiation Oncology | Admitting: Radiation Oncology

## 2019-12-27 ENCOUNTER — Other Ambulatory Visit: Payer: Self-pay

## 2019-12-27 DIAGNOSIS — C060 Malignant neoplasm of cheek mucosa: Secondary | ICD-10-CM | POA: Insufficient documentation

## 2019-12-27 DIAGNOSIS — C069 Malignant neoplasm of mouth, unspecified: Secondary | ICD-10-CM | POA: Diagnosis not present

## 2019-12-27 LAB — CBC
HCT: 37.3 % (ref 36.0–46.0)
Hemoglobin: 12.3 g/dL (ref 12.0–15.0)
MCH: 30 pg (ref 26.0–34.0)
MCHC: 33 g/dL (ref 30.0–36.0)
MCV: 91 fL (ref 80.0–100.0)
Platelets: 254 10*3/uL (ref 150–400)
RBC: 4.1 MIL/uL (ref 3.87–5.11)
RDW: 12.3 % (ref 11.5–15.5)
WBC: 6.9 10*3/uL (ref 4.0–10.5)
nRBC: 0 % (ref 0.0–0.2)

## 2019-12-28 ENCOUNTER — Ambulatory Visit: Payer: Medicare Other

## 2019-12-28 ENCOUNTER — Inpatient Hospital Stay: Payer: Medicare Other

## 2019-12-29 ENCOUNTER — Ambulatory Visit: Payer: Medicare Other

## 2019-12-30 ENCOUNTER — Ambulatory Visit: Payer: Medicare Other

## 2020-01-02 ENCOUNTER — Ambulatory Visit: Payer: Medicare Other

## 2020-01-03 ENCOUNTER — Ambulatory Visit: Payer: Medicare Other

## 2020-01-03 ENCOUNTER — Inpatient Hospital Stay: Payer: Medicare Other

## 2020-01-04 ENCOUNTER — Ambulatory Visit: Payer: Medicare Other

## 2020-01-04 ENCOUNTER — Inpatient Hospital Stay: Payer: Medicare Other

## 2020-01-05 ENCOUNTER — Ambulatory Visit
Admission: RE | Admit: 2020-01-05 | Discharge: 2020-01-05 | Disposition: A | Discharge status: Home / Self Care | Payer: Medicare Other | Source: Ambulatory Visit | Attending: Radiation Oncology | Admitting: Radiation Oncology

## 2020-01-05 DIAGNOSIS — C069 Malignant neoplasm of mouth, unspecified: Secondary | ICD-10-CM | POA: Diagnosis not present

## 2020-01-06 ENCOUNTER — Ambulatory Visit
Admission: RE | Admit: 2020-01-06 | Discharge: 2020-01-06 | Disposition: A | Discharge status: Home / Self Care | Payer: Medicare Other | Source: Ambulatory Visit | Attending: Radiation Oncology | Admitting: Radiation Oncology

## 2020-01-06 DIAGNOSIS — C069 Malignant neoplasm of mouth, unspecified: Secondary | ICD-10-CM | POA: Diagnosis not present

## 2020-01-09 ENCOUNTER — Ambulatory Visit
Admission: RE | Admit: 2020-01-09 | Discharge: 2020-01-09 | Disposition: A | Discharge status: Home / Self Care | Payer: Medicare Other | Source: Ambulatory Visit | Attending: Radiation Oncology | Admitting: Radiation Oncology

## 2020-01-09 DIAGNOSIS — C069 Malignant neoplasm of mouth, unspecified: Secondary | ICD-10-CM | POA: Diagnosis not present

## 2020-01-10 ENCOUNTER — Inpatient Hospital Stay: Payer: Medicare Other

## 2020-01-10 ENCOUNTER — Ambulatory Visit
Admission: RE | Admit: 2020-01-10 | Discharge: 2020-01-10 | Disposition: A | Discharge status: Home / Self Care | Payer: Medicare Other | Source: Ambulatory Visit | Attending: Radiation Oncology | Admitting: Radiation Oncology

## 2020-01-10 ENCOUNTER — Other Ambulatory Visit: Payer: Self-pay

## 2020-01-10 DIAGNOSIS — C069 Malignant neoplasm of mouth, unspecified: Secondary | ICD-10-CM | POA: Diagnosis not present

## 2020-01-10 DIAGNOSIS — C060 Malignant neoplasm of cheek mucosa: Secondary | ICD-10-CM | POA: Diagnosis not present

## 2020-01-10 LAB — CBC
HCT: 38.6 % (ref 36.0–46.0)
Hemoglobin: 12.7 g/dL (ref 12.0–15.0)
MCH: 30.1 pg (ref 26.0–34.0)
MCHC: 32.9 g/dL (ref 30.0–36.0)
MCV: 91.5 fL (ref 80.0–100.0)
Platelets: 267 10*3/uL (ref 150–400)
RBC: 4.22 MIL/uL (ref 3.87–5.11)
RDW: 12.2 % (ref 11.5–15.5)
WBC: 10.1 10*3/uL (ref 4.0–10.5)
nRBC: 0 % (ref 0.0–0.2)

## 2020-01-11 ENCOUNTER — Ambulatory Visit
Admission: RE | Admit: 2020-01-11 | Discharge: 2020-01-11 | Disposition: A | Discharge status: Home / Self Care | Payer: Medicare Other | Source: Ambulatory Visit | Attending: Radiation Oncology | Admitting: Radiation Oncology

## 2020-01-11 ENCOUNTER — Inpatient Hospital Stay: Payer: Medicare Other

## 2020-01-11 DIAGNOSIS — C069 Malignant neoplasm of mouth, unspecified: Secondary | ICD-10-CM | POA: Diagnosis not present

## 2020-01-12 ENCOUNTER — Ambulatory Visit
Admission: RE | Admit: 2020-01-12 | Discharge: 2020-01-12 | Disposition: A | Discharge status: Home / Self Care | Payer: Medicare Other | Source: Ambulatory Visit | Attending: Radiation Oncology | Admitting: Radiation Oncology

## 2020-01-12 DIAGNOSIS — C069 Malignant neoplasm of mouth, unspecified: Secondary | ICD-10-CM | POA: Diagnosis not present

## 2020-01-13 ENCOUNTER — Ambulatory Visit
Admission: RE | Admit: 2020-01-13 | Discharge: 2020-01-13 | Disposition: A | Discharge status: Home / Self Care | Payer: Medicare Other | Source: Ambulatory Visit | Attending: Radiation Oncology | Admitting: Radiation Oncology

## 2020-01-13 DIAGNOSIS — C069 Malignant neoplasm of mouth, unspecified: Secondary | ICD-10-CM | POA: Diagnosis not present

## 2020-01-14 ENCOUNTER — Ambulatory Visit: Admission: RE | Admit: 2020-01-14 | Payer: Medicare Other | Source: Ambulatory Visit

## 2020-01-16 ENCOUNTER — Ambulatory Visit
Admission: RE | Admit: 2020-01-16 | Discharge: 2020-01-16 | Disposition: A | Discharge status: Home / Self Care | Payer: Medicare Other | Source: Ambulatory Visit | Attending: Radiation Oncology | Admitting: Radiation Oncology

## 2020-01-16 DIAGNOSIS — C069 Malignant neoplasm of mouth, unspecified: Secondary | ICD-10-CM | POA: Diagnosis not present

## 2020-01-17 ENCOUNTER — Other Ambulatory Visit: Payer: Self-pay

## 2020-01-17 ENCOUNTER — Inpatient Hospital Stay: Payer: Medicare Other

## 2020-01-17 ENCOUNTER — Ambulatory Visit
Admission: RE | Admit: 2020-01-17 | Discharge: 2020-01-17 | Disposition: A | Discharge status: Home / Self Care | Payer: Medicare Other | Source: Ambulatory Visit | Attending: Radiation Oncology | Admitting: Radiation Oncology

## 2020-01-17 DIAGNOSIS — C069 Malignant neoplasm of mouth, unspecified: Secondary | ICD-10-CM

## 2020-01-17 DIAGNOSIS — C060 Malignant neoplasm of cheek mucosa: Secondary | ICD-10-CM | POA: Diagnosis not present

## 2020-01-17 LAB — CBC
HCT: 37.2 % (ref 36.0–46.0)
Hemoglobin: 12.3 g/dL (ref 12.0–15.0)
MCH: 30.3 pg (ref 26.0–34.0)
MCHC: 33.1 g/dL (ref 30.0–36.0)
MCV: 91.6 fL (ref 80.0–100.0)
Platelets: 213 10*3/uL (ref 150–400)
RBC: 4.06 MIL/uL (ref 3.87–5.11)
RDW: 12.5 % (ref 11.5–15.5)
WBC: 7.9 10*3/uL (ref 4.0–10.5)
nRBC: 0 % (ref 0.0–0.2)

## 2020-01-18 ENCOUNTER — Inpatient Hospital Stay: Payer: Medicare Other

## 2020-01-18 ENCOUNTER — Ambulatory Visit
Admission: RE | Admit: 2020-01-18 | Discharge: 2020-01-18 | Disposition: A | Discharge status: Home / Self Care | Payer: Medicare Other | Source: Ambulatory Visit | Attending: Radiation Oncology | Admitting: Radiation Oncology

## 2020-01-18 DIAGNOSIS — C069 Malignant neoplasm of mouth, unspecified: Secondary | ICD-10-CM | POA: Diagnosis not present

## 2020-01-19 ENCOUNTER — Ambulatory Visit
Admission: RE | Admit: 2020-01-19 | Discharge: 2020-01-19 | Disposition: A | Discharge status: Home / Self Care | Payer: Medicare Other | Source: Ambulatory Visit | Attending: Radiation Oncology | Admitting: Radiation Oncology

## 2020-01-19 DIAGNOSIS — C069 Malignant neoplasm of mouth, unspecified: Secondary | ICD-10-CM | POA: Diagnosis not present

## 2020-01-20 ENCOUNTER — Ambulatory Visit
Admission: RE | Admit: 2020-01-20 | Discharge: 2020-01-20 | Disposition: A | Discharge status: Home / Self Care | Payer: Medicare Other | Source: Ambulatory Visit | Attending: Radiation Oncology | Admitting: Radiation Oncology

## 2020-01-20 DIAGNOSIS — C069 Malignant neoplasm of mouth, unspecified: Secondary | ICD-10-CM | POA: Diagnosis not present

## 2020-01-23 ENCOUNTER — Ambulatory Visit
Admission: RE | Admit: 2020-01-23 | Discharge: 2020-01-23 | Disposition: A | Discharge status: Home / Self Care | Payer: Medicare Other | Source: Ambulatory Visit | Attending: Radiation Oncology | Admitting: Radiation Oncology

## 2020-01-23 DIAGNOSIS — C069 Malignant neoplasm of mouth, unspecified: Secondary | ICD-10-CM | POA: Diagnosis not present

## 2020-01-24 ENCOUNTER — Inpatient Hospital Stay: Payer: Medicare Other

## 2020-01-24 ENCOUNTER — Ambulatory Visit
Admission: RE | Admit: 2020-01-24 | Discharge: 2020-01-24 | Disposition: A | Discharge status: Home / Self Care | Payer: Medicare Other | Source: Ambulatory Visit | Attending: Radiation Oncology | Admitting: Radiation Oncology

## 2020-01-24 ENCOUNTER — Other Ambulatory Visit: Payer: Self-pay

## 2020-01-24 DIAGNOSIS — C060 Malignant neoplasm of cheek mucosa: Secondary | ICD-10-CM | POA: Diagnosis not present

## 2020-01-24 DIAGNOSIS — C069 Malignant neoplasm of mouth, unspecified: Secondary | ICD-10-CM | POA: Diagnosis not present

## 2020-01-24 LAB — CBC
HCT: 35.3 % — ABNORMAL LOW (ref 36.0–46.0)
Hemoglobin: 11.7 g/dL — ABNORMAL LOW (ref 12.0–15.0)
MCH: 30.3 pg (ref 26.0–34.0)
MCHC: 33.1 g/dL (ref 30.0–36.0)
MCV: 91.5 fL (ref 80.0–100.0)
Platelets: 261 10*3/uL (ref 150–400)
RBC: 3.86 MIL/uL — ABNORMAL LOW (ref 3.87–5.11)
RDW: 12.9 % (ref 11.5–15.5)
WBC: 7.3 10*3/uL (ref 4.0–10.5)
nRBC: 0 % (ref 0.0–0.2)

## 2020-01-25 ENCOUNTER — Ambulatory Visit: Payer: Medicare Other

## 2020-01-25 ENCOUNTER — Ambulatory Visit
Admission: RE | Admit: 2020-01-25 | Discharge: 2020-01-25 | Disposition: A | Discharge status: Home / Self Care | Payer: Medicare Other | Source: Ambulatory Visit | Attending: Radiation Oncology | Admitting: Radiation Oncology

## 2020-01-25 DIAGNOSIS — C069 Malignant neoplasm of mouth, unspecified: Secondary | ICD-10-CM | POA: Insufficient documentation

## 2020-01-26 ENCOUNTER — Ambulatory Visit
Admission: RE | Admit: 2020-01-26 | Discharge: 2020-01-26 | Disposition: A | Discharge status: Home / Self Care | Payer: Medicare Other | Source: Ambulatory Visit | Attending: Radiation Oncology | Admitting: Radiation Oncology

## 2020-01-26 DIAGNOSIS — C069 Malignant neoplasm of mouth, unspecified: Secondary | ICD-10-CM | POA: Diagnosis not present

## 2020-01-27 ENCOUNTER — Other Ambulatory Visit: Payer: Self-pay | Admitting: *Deleted

## 2020-01-27 ENCOUNTER — Inpatient Hospital Stay: Payer: Medicare Other

## 2020-01-27 ENCOUNTER — Ambulatory Visit
Admission: RE | Admit: 2020-01-27 | Discharge: 2020-01-27 | Disposition: A | Discharge status: Home / Self Care | Payer: Medicare Other | Source: Ambulatory Visit | Attending: Radiation Oncology | Admitting: Radiation Oncology

## 2020-01-27 ENCOUNTER — Inpatient Hospital Stay: Payer: Medicare Other | Attending: Nurse Practitioner | Admitting: Nurse Practitioner

## 2020-01-27 VITALS — BP 144/91 | HR 121 | Temp 98.7°F | Resp 20 | Wt 121.6 lb

## 2020-01-27 DIAGNOSIS — C069 Malignant neoplasm of mouth, unspecified: Secondary | ICD-10-CM | POA: Diagnosis not present

## 2020-01-27 DIAGNOSIS — Z87891 Personal history of nicotine dependence: Secondary | ICD-10-CM | POA: Insufficient documentation

## 2020-01-27 DIAGNOSIS — B370 Candidal stomatitis: Secondary | ICD-10-CM | POA: Diagnosis present

## 2020-01-27 DIAGNOSIS — C060 Malignant neoplasm of cheek mucosa: Secondary | ICD-10-CM | POA: Insufficient documentation

## 2020-01-27 DIAGNOSIS — E860 Dehydration: Secondary | ICD-10-CM

## 2020-01-27 LAB — CBC WITH DIFFERENTIAL/PLATELET
Abs Immature Granulocytes: 0.02 10*3/uL (ref 0.00–0.07)
Basophils Absolute: 0 10*3/uL (ref 0.0–0.1)
Basophils Relative: 0 %
Eosinophils Absolute: 0.3 10*3/uL (ref 0.0–0.5)
Eosinophils Relative: 5 %
HCT: 37.8 % (ref 36.0–46.0)
Hemoglobin: 12.6 g/dL (ref 12.0–15.0)
Immature Granulocytes: 0 %
Lymphocytes Relative: 22 %
Lymphs Abs: 1.4 10*3/uL (ref 0.7–4.0)
MCH: 30.2 pg (ref 26.0–34.0)
MCHC: 33.3 g/dL (ref 30.0–36.0)
MCV: 90.6 fL (ref 80.0–100.0)
Monocytes Absolute: 0.5 10*3/uL (ref 0.1–1.0)
Monocytes Relative: 9 %
Neutro Abs: 4 10*3/uL (ref 1.7–7.7)
Neutrophils Relative %: 64 %
Platelets: 266 10*3/uL (ref 150–400)
RBC: 4.17 MIL/uL (ref 3.87–5.11)
RDW: 12.9 % (ref 11.5–15.5)
WBC: 6.3 10*3/uL (ref 4.0–10.5)
nRBC: 0 % (ref 0.0–0.2)

## 2020-01-27 LAB — COMPREHENSIVE METABOLIC PANEL
ALT: 13 U/L (ref 0–44)
AST: 21 U/L (ref 15–41)
Albumin: 4.1 g/dL (ref 3.5–5.0)
Alkaline Phosphatase: 93 U/L (ref 38–126)
Anion gap: 13 (ref 5–15)
BUN: 23 mg/dL (ref 8–23)
CO2: 25 mmol/L (ref 22–32)
Calcium: 9.3 mg/dL (ref 8.9–10.3)
Chloride: 104 mmol/L (ref 98–111)
Creatinine, Ser: 0.77 mg/dL (ref 0.44–1.00)
GFR calc Af Amer: >60 mL/min (ref >60)
GFR calc non Af Amer: >60 mL/min (ref >60)
Glucose, Bld: 134 mg/dL — ABNORMAL HIGH (ref 70–99)
Potassium: 3.6 mmol/L (ref 3.5–5.1)
Sodium: 142 mmol/L (ref 135–145)
Total Bilirubin: 0.7 mg/dL (ref 0.3–1.2)
Total Protein: 7.2 g/dL (ref 6.5–8.1)

## 2020-01-27 MED ORDER — FLUCONAZOLE 100 MG PO TABS: ORAL_TABLET | ORAL | 0 refills | Status: AC

## 2020-01-27 MED ORDER — MAGIC MOUTHWASH W/LIDOCAINE: 5.0000 mL | Freq: Four times a day (QID) | ORAL | 0 refills | Status: DC | PRN

## 2020-01-27 MED ORDER — SODIUM CHLORIDE 0.9 % IV SOLN
Freq: Once | INTRAVENOUS | Status: AC
  Filled 2020-01-27: qty 250

## 2020-01-27 MED ORDER — OXYCODONE HCL 5 MG PO TABS: 5.0000 mg | ORAL_TABLET | Freq: Four times a day (QID) | ORAL | 0 refills | Status: DC | PRN

## 2020-01-27 NOTE — Progress Notes (Signed)
Symptom Management [place]  Telephone:(336) [phone] Fax:(336) [phone]  Patient Care Team: Kirk Ruths, MD as PCP - General (Internal Medicine) Kirk Ruths, MD (Internal Medicine) Nyoka Cowden Phylis Bougie, NP as Nurse Practitioner (Geriatric Medicine) Lloyd Huger, MD as Consulting Physician (Oncology) Noreene Filbert, MD as Radiation Oncologist (Radiation Oncology)   Name of the patient: Katie Logan  [id number]  1930-02-28   Date of visit: 01/27/20  Diagnosis-right-sided buccal mucosa squamous cell carcinoma  Chief complaint/ Reason for visit-mouth pain  Heme/Onc history: Patient initially presented as 86 year old female with painful ulcerated lesion of the right buccal mucosa.  Hypermetabolic on PET without lymph node involvement.  Biopsy was positive for nonkeratinizing squamous cell carcinoma.  Initiated IMRT radiation therapy on 12/13/2019. Oncology History   No history exists.    Interval history-patient with above history of squamous cell carcinoma currently receiving radiation to the mouth presents to symptom management clinic for complaints of mouth pain at site of radiation.  Symptoms have been gradually worsening over the past week.  Says she has noticed a cracking fissure at the corner of her mouth as well as painful spots inside her mouth and lips.  Area feels swollen and is red.  Difficult to eat or drink due to pain.  Had previously been taking a mouthwash but was discontinued due to her inability to follow directions to spit mouthwash due to history of dementia.  She has been using hydrocortisone topically for inflammation.  Continues to take sucralfate as well.  Swallowing is not painful.  Also taking oral Decadron.  Unable to rate pain but says it is 'quite painful'.  No fevers or chills.  No nausea, vomiting, constipation, diarrhea.  Review of systems- Review of Systems  Unable to perform ROS: Dementia (ROS limited d/t  dementia)  Constitutional: Negative for chills, fever and malaise/fatigue.  HENT: Negative for congestion, ear pain, nosebleeds, sinus pain and sore throat.        Mouth pain  Eyes: Negative for pain and redness.  Gastrointestinal: Negative for constipation, diarrhea, nausea and vomiting.  Psychiatric/Behavioral: Positive for memory loss.      Allergies  Allergen Reactions  . Epinephrine Other (See Comments)    Other Reaction: Passed out after dental inject  . Latex Other (See Comments)    Other Reaction: angioedema, mouth burns  . Levofloxacin     Other reaction(s): Hallucination  . Other Other (See Comments)    Uncoded Allergy. Allergen: ENVIRONMENTAL ALLERGIES    Past Medical History:  Diagnosis Date  . Diabetes mellitus ([place])    diet controlled  . Diabetes mellitus without complication ([place])   . Dry eye syndrome   . Hyperlipidemia, unspecified   . Hypertension   . Inflammatory arthritis   . Osteoporosis   . Postmenopausal     Past Surgical History:  Procedure Laterality Date  . ABDOMINAL HYSTERECTOMY    . APPENDECTOMY    . CAPSULOTOMY     OS  . CATARACT EXTRACTION W/PHACO Right 08/2008   crystalens 24.0D  . CATARACT EXTRACTION [facility] Left 01/18/2009   Crystalens 24.5D    Social History   Socioeconomic History  . Marital status: Widowed    Spouse name: Not on file  . Number of children: 2  . Years of education: Not on file  . Highest education level: Not on file  Occupational History  . Not on file  Tobacco Use  . Smoking status: Former Smoker    Types: Cigarettes  Quit date: 05/26/1968    Years since quitting: 51.7  . Smokeless tobacco: Never Used  Vaping Use  . Vaping Use: Never used  Substance and Sexual Activity  . Alcohol use: No  . Drug use: No  . Sexual activity: Not on file  Other Topics Concern  . Not on file  Social History Narrative   Admitted to [facility] 11/21/2016   Widowed   2 children   Former Smoker   Does not  drink alcohol    Full Code   Social Determinants of Radio broadcast assistant Strain:   . Difficulty of Paying Living Expenses: Not on file  Food Insecurity:   . Worried About Charity fundraiser in the Last Year: Not on file  . Ran Out of Food in the Last Year: Not on file  Transportation Needs:   . Lack of Transportation (Medical): Not on file  . Lack of Transportation (Non-Medical): Not on file  Physical Activity:   . Days of Exercise per Week: Not on file  . Minutes of Exercise per Session: Not on file  Stress:   . Feeling of Stress : Not on file  Social Connections:   . Frequency of Communication with Friends and Family: Not on file  . Frequency of Social Gatherings with Friends and Family: Not on file  . Attends Religious Services: Not on file  . Active Member of Clubs or Organizations: Not on file  . Attends Archivist Meetings: Not on file  . Marital Status: Not on file  Intimate Partner Violence:   . Fear of Current or Ex-Partner: Not on file  . Emotionally Abused: Not on file  . Physically Abused: Not on file  . Sexually Abused: Not on file    Family History  Problem Relation Age of Onset  . Alzheimer's disease Mother   . Hypertension Father   . Alzheimer's disease Sister   . Alzheimer's disease Sister      Current Outpatient Medications:  .  acetaminophen (TYLENOL ARTHRITIS PAIN) 650 MG CR tablet, Take 1,300 mg by mouth 2 (two) times daily. , Disp: , Rfl:  .  atorvastatin (LIPITOR) 10 MG tablet, Take 10 mg by mouth daily., Disp: , Rfl:  .  azithromycin (ZITHROMAX) 250 MG tablet, Take 250 mg by mouth as directed., Disp: , Rfl:  .  dexamethasone (DECADRON) 0.5 MG/5ML solution, Take 0.5 mg by mouth See admin instructions. Rinse 56ml orally for 1 minute and spit out 4 times daily, Disp: , Rfl:  .  enalapril (VASOTEC) 20 MG tablet, Take 20 mg by mouth 2 (two) times daily. , Disp: , Rfl:  .  guaiFENesin (ROBITUSSIN) 100 MG/5ML liquid, Take 200 mg by  mouth 3 (three) times daily as needed for cough. , Disp: , Rfl:  .  levothyroxine (SYNTHROID, LEVOTHROID) 50 MCG tablet, Take 50 mcg by mouth daily before breakfast. , Disp: , Rfl:  .  magnesium hydroxide (MILK OF MAGNESIA) 400 MG/5ML suspension, Take 30 mLs by mouth daily as needed for moderate constipation. , Disp: , Rfl:  .  metFORMIN (GLUCOPHAGE) 500 MG tablet, Take 500 mg by mouth 2 (two) times daily with a meal. , Disp: , Rfl:  .  potassium chloride (K-DUR,KLOR-CON) 10 MEQ tablet, Take 20 mEq by mouth daily. , Disp: , Rfl:  .  rivastigmine (EXELON) 9.5 mg/24hr, Place 9.5 mg onto the skin daily. 0745, Disp: , Rfl:  .  sucralfate (CARAFATE) 1 g tablet, Take 1  tablet (1 g total) by mouth 3 (three) times daily. Dissolve in 3-4 tbsp warm water, swish and swallow., Disp: 90 tablet, Rfl: 1 .  traMADol (ULTRAM) 50 MG tablet, Take 50 mg by mouth 4 (four) times daily as needed., Disp: , Rfl:   Physical exam:  Vitals:   01/27/20 1236  BP: (!) 144/91  Pulse: (!) 121  Resp: 20  Temp: 98.7 F (37.1 C)  SpO2: 98%  Weight: 121 lb 9.6 oz (55.2 kg)   Physical Exam Constitutional:      General: She is not in acute distress. HENT:     Head: Normocephalic.     Mouth/Throat:     Comments: Cracked fissure at right corner of mouth. Painful appearing. Surrounding erythema and edema. White plaques on insides of lips and on buccal surfaces. None on tongue.  Skin:    General: Skin is warm and dry.  Neurological:     Mental Status: She is alert. Mental status is at baseline.  Psychiatric:        Mood and Affect: Mood normal.      CMP Latest Ref Rng & Units 01/27/2020  Glucose 70 - 99 mg/dL 134(H)  BUN 8 - 23 mg/dL 23  Creatinine 0.44 - 1.00 mg/dL 0.77  Sodium 135 - 145 mmol/L 142  Potassium 3.5 - 5.1 mmol/L 3.6  Chloride 98 - 111 mmol/L 104  CO2 22 - 32 mmol/L 25  Calcium 8.9 - 10.3 mg/dL 9.3  Total Protein 6.5 - 8.1 g/dL 7.2  Total Bilirubin 0.3 - 1.2 mg/dL 0.7  Alkaline Phos 38 - 126 U/L 93    AST 15 - 41 U/L 21  ALT 0 - 44 U/L 13   CBC Latest Ref Rng & Units 01/27/2020  WBC 4.0 - 10.5 K/uL 6.3  Hemoglobin 12.0 - 15.0 g/dL 12.6  Hematocrit 36 - 46 % 37.8  Platelets 150 - 400 K/uL 266    No images are attached to the encounter.  No results found.  Assessment and plan- Patient is a 86 y.o. female diagnosed with squamous cell carcinoma of the mouth currently receiving radiation treatment who presents to symptom management clinic for mouth pain.  Etiology unclear but I suspect multifactorial etiology.  Some underlying yeast for which I will will prescribe Diflucan 200 mg on day 1 followed by 100 mg daily for a total of 2 weeks of treatment.  May need extended course.  We will also prescribe Magic mouthwash with nystatin.  Okay to swish and swallow.  For comfort and inflammation.  She is at high risk for oral yeast given radiation, history of diabetes and steroid use..  May benefit from ongoing prophylaxis during treatment.  Will consider if symptoms recur.  We will also change her pain medication.  Stop tramadol.  Start oxycodone 5 mg every 6 hours as needed for pain unrelieved by Tylenol.  She is somewhat dry appearing and has altered oral intake due to pain.  We will give her liter of IV fluids in clinic today.  In setting of dementia I called medication changes to the nurse at her facility and provided paper prescriptions.  I asked staff to please notify clinic if symptoms do not improve over the weekend as we may need to hold radiation on Tuesday.  If symptoms improve however, okay to proceed.     Visit Diagnosis 1. Oropharyngeal candidiasis     Patient expressed understanding and was in agreement with this plan. She also understands that She can  call clinic at any time with any questions, concerns, or complaints.   Thank you for allowing me to participate in the care of this very pleasant patient.   Beckey Rutter, DNP, AGNP-C [facility] [place]

## 2020-01-27 NOTE — Progress Notes (Signed)
Pt mouth with ulcers and redness from XRT therapy. Hydration given with 1 L NS. Pt is pleasantly confused, oriented to herself. Accompanied by driver from [facility]. Tolerated IVF well. Discharged with written prescriptions.

## 2020-01-28 NOTE — Progress Notes (Deleted)
[place]  Telephone:(336) [phone] Fax:(336) [phone]  ID: Katie Logan OB: 1929-11-18  MR#: [id number]  FHL#:[id number]  Patient Care Team: Kirk Ruths, MD as PCP - General (Internal Medicine) Kirk Ruths, MD (Internal Medicine) Nyoka Cowden Phylis Bougie, NP as Nurse Practitioner (Geriatric Medicine) Lloyd Huger, MD as Consulting Physician (Oncology) Noreene Filbert, MD as Radiation Oncologist (Radiation Oncology)  CHIEF COMPLAINT: Squamous cell carcinoma of oral cavity.  INTERVAL HISTORY: Patient returns to clinic today for further evaluation and discussion of her PET scan results.  She does not complain of dysphagia.  She has no neurologic complaints.  She denies any recent fevers or illnesses.  She has a fair appetite, but denies weight loss.  She has no chest pain, shortness of breath, cough, or hemoptysis.  She denies any nausea, vomiting, constipation, or diarrhea.  She has no urinary complaints.  Patient offers no further specific complaints today.  REVIEW OF SYSTEMS:   Review of Systems  Constitutional: Negative.  Negative for fever, malaise/fatigue and weight loss.  Respiratory: Negative.  Negative for cough, hemoptysis and shortness of breath.   Cardiovascular: Negative.  Negative for chest pain and leg swelling.  Gastrointestinal: Negative.  Negative for abdominal pain, blood in stool and melena.  Genitourinary: Negative.  Negative for hematuria.  Musculoskeletal: Negative.   Skin: Negative.  Negative for rash.  Neurological: Negative.  Negative for dizziness, focal weakness, weakness and headaches.  Psychiatric/Behavioral: Positive for memory loss. The patient is not nervous/anxious.     As per HPI. Otherwise, a complete review of systems is negative.  PAST MEDICAL HISTORY: Past Medical History:  Diagnosis Date   Diabetes mellitus ([place])    diet controlled   Diabetes mellitus without complication ([facility])    Dry eye syndrome      Hyperlipidemia, unspecified    Hypertension    Inflammatory arthritis    Osteoporosis    Postmenopausal     PAST SURGICAL HISTORY: Past Surgical History:  Procedure Laterality Date   ABDOMINAL HYSTERECTOMY     APPENDECTOMY     CAPSULOTOMY     OS   CATARACT EXTRACTION W/PHACO Right 08/2008   crystalens 24.0D   CATARACT EXTRACTION W/PHACO Left 01/18/2009   Crystalens 24.5D    FAMILY HISTORY: Family History  Problem Relation Age of Onset   Alzheimer's disease Mother    Hypertension Father    Alzheimer's disease Sister    Alzheimer's disease Sister     ADVANCED DIRECTIVES (Y/N):  N  HEALTH MAINTENANCE: Social History   Tobacco Use   Smoking status: Former Smoker    Types: Cigarettes    Quit date: 05/26/1968    Years since quitting: 51.7   Smokeless tobacco: Never Used  Vaping Use   Vaping Use: Never used  Substance Use Topics   Alcohol use: No   Drug use: No     Colonoscopy:  PAP:  Bone density:  Lipid panel:  Allergies  Allergen Reactions   Epinephrine Other (See Comments)    Other Reaction: Passed out after dental inject   Latex Other (See Comments)    Other Reaction: angioedema, mouth burns   Levofloxacin     Other reaction(s): Hallucination   Other Other (See Comments)    Uncoded Allergy. Allergen: ENVIRONMENTAL ALLERGIES    Current Outpatient Medications  Medication Sig Dispense Refill   acetaminophen (TYLENOL ARTHRITIS PAIN) 650 MG CR tablet Take 1,300 mg by mouth 2 (two) times daily.      atorvastatin (LIPITOR) 10  MG tablet Take 10 mg by mouth daily.     azithromycin (ZITHROMAX) 250 MG tablet Take 250 mg by mouth as directed.     dexamethasone (DECADRON) 0.5 MG/5ML solution Take 0.5 mg by mouth See admin instructions. Rinse 1ml orally for 1 minute and spit out 4 times daily     enalapril (VASOTEC) 20 MG tablet Take 20 mg by mouth 2 (two) times daily.      fluconazole (DIFLUCAN) 100 MG tablet Take 2 tablets (200 mg  total) by mouth daily for 1 day, THEN 1 tablet (100 mg total) daily for 13 days. 15 tablet 0   guaiFENesin (ROBITUSSIN) 100 MG/5ML liquid Take 200 mg by mouth 3 (three) times daily as needed for cough.      levothyroxine (SYNTHROID, LEVOTHROID) 50 MCG tablet Take 50 mcg by mouth daily before breakfast.      magic mouthwash w/lidocaine SOLN Take 5 mLs by mouth 4 (four) times daily as needed for mouth pain. Swish and swallow 280 mL 0   magnesium hydroxide (MILK OF MAGNESIA) 400 MG/5ML suspension Take 30 mLs by mouth daily as needed for moderate constipation.      metFORMIN (GLUCOPHAGE) 500 MG tablet Take 500 mg by mouth 2 (two) times daily with a meal.      oxyCODONE (OXY IR/ROXICODONE) 5 MG immediate release tablet Take 1 tablet (5 mg total) by mouth every 6 (six) hours as needed for severe pain (unrelieved by tylenol). 30 tablet 0   potassium chloride (K-DUR,KLOR-CON) 10 MEQ tablet Take 20 mEq by mouth daily.      rivastigmine (EXELON) 9.5 mg/24hr Place 9.5 mg onto the skin daily. 0745     sucralfate (CARAFATE) 1 g tablet Take 1 tablet (1 g total) by mouth 3 (three) times daily. Dissolve in 3-4 tbsp warm water, swish and swallow. 90 tablet 1   No current facility-administered medications for this visit.    OBJECTIVE: There were no vitals filed for this visit.   There is no height or weight on file to calculate BMI.    ECOG FS:1 - Symptomatic but completely ambulatory  General: Well-developed, well-nourished, no acute distress. Eyes: Pink conjunctiva, anicteric sclera. HEENT: Normocephalic, moist mucous membranes. Ulcerated lesion noted on the right buccal membrane.  No palpable lymphadenopathy. Lungs: No audible wheezing or coughing. Heart: Regular rate and rhythm. Abdomen: Soft, nontender, no obvious distention. Musculoskeletal: No edema, cyanosis, or clubbing. Neuro: Alert, answering all questions appropriately. Cranial nerves grossly intact. Skin: No rashes or petechiae  noted. Psych: Normal affect.  LAB RESULTS:  Lab Results  Component Value Date   NA 142 01/27/2020   K 3.6 01/27/2020   CL 104 01/27/2020   CO2 25 01/27/2020   GLUCOSE 134 (H) 01/27/2020   BUN 23 01/27/2020   CREATININE 0.77 01/27/2020   CALCIUM 9.3 01/27/2020   PROT 7.2 01/27/2020   ALBUMIN 4.1 01/27/2020   AST 21 01/27/2020   ALT 13 01/27/2020   ALKPHOS 93 01/27/2020   BILITOT 0.7 01/27/2020   GFRNONAA >60 01/27/2020   GFRAA >60 01/27/2020    Lab Results  Component Value Date   WBC 6.3 01/27/2020   NEUTROABS 4.0 01/27/2020   HGB 12.6 01/27/2020   HCT 37.8 01/27/2020   MCV 90.6 01/27/2020   PLT 266 01/27/2020     STUDIES: No results found.  ASSESSMENT: Squamous cell carcinoma of oral cavity.  PLAN:    1. Squamous cell carcinoma of oral cavity: PET scan results from November 22, 2027 reviewed  independently and reported as above with no obvious metastatic disease.  Given her advanced age and decreased memory, do not recommend chemotherapy for a localized lesion.  Patient has an appointment with radiation oncology later this morning for initial consultation and treatment planning.  Patient will follow up at the end of her XRT for further evaluation and discussion of future diagnostic planning.    I spent a total of 30 minutes reviewing chart data, face-to-face evaluation with the patient, counseling and coordination of care as detailed above.   Patient expressed understanding and was in agreement with this plan. She also understands that She can call clinic at any time with any questions, concerns, or complaints.   Cancer Staging No matching staging information was found for the patient.  Lloyd Huger, MD   01/28/2020 10:43 AM

## 2020-01-31 ENCOUNTER — Ambulatory Visit
Admission: RE | Admit: 2020-01-31 | Discharge: 2020-01-31 | Disposition: A | Discharge status: Home / Self Care | Payer: Medicare Other | Source: Ambulatory Visit | Attending: Radiation Oncology | Admitting: Radiation Oncology

## 2020-01-31 ENCOUNTER — Other Ambulatory Visit: Payer: Self-pay

## 2020-01-31 ENCOUNTER — Ambulatory Visit: Payer: Medicare Other

## 2020-01-31 ENCOUNTER — Inpatient Hospital Stay: Payer: Medicare Other

## 2020-01-31 ENCOUNTER — Telehealth: Payer: Self-pay | Admitting: *Deleted

## 2020-01-31 ENCOUNTER — Encounter: Payer: Self-pay | Admitting: Nurse Practitioner

## 2020-01-31 DIAGNOSIS — C069 Malignant neoplasm of mouth, unspecified: Secondary | ICD-10-CM

## 2020-01-31 DIAGNOSIS — B370 Candidal stomatitis: Secondary | ICD-10-CM | POA: Diagnosis not present

## 2020-01-31 LAB — CBC
HCT: 36.5 % (ref 36.0–46.0)
Hemoglobin: 12 g/dL (ref 12.0–15.0)
MCH: 29.9 pg (ref 26.0–34.0)
MCHC: 32.9 g/dL (ref 30.0–36.0)
MCV: 90.8 fL (ref 80.0–100.0)
Platelets: 272 10*3/uL (ref 150–400)
RBC: 4.02 MIL/uL (ref 3.87–5.11)
RDW: 12.9 % (ref 11.5–15.5)
WBC: 8.2 10*3/uL (ref 4.0–10.5)
nRBC: 0 % (ref 0.0–0.2)

## 2020-01-31 NOTE — Telephone Encounter (Signed)
Nikki from [employer] at [facility] called for order clarification. She wanted to know if patient should discontinue tramadol while taking oxyCODONE 5mg .

## 2020-01-31 NOTE — Telephone Encounter (Signed)
Yes, she likely does not need both.

## 2020-01-31 NOTE — Telephone Encounter (Signed)
Agree. Thanks for providing d/c order.

## 2020-01-31 NOTE — Telephone Encounter (Signed)
Call returned, they need written order to d/c tramadol. Will complete order and take down to radiation for pt to take back today.

## 2020-02-01 ENCOUNTER — Ambulatory Visit: Payer: Medicare Other

## 2020-02-01 ENCOUNTER — Ambulatory Visit
Admission: RE | Admit: 2020-02-01 | Discharge: 2020-02-01 | Disposition: A | Discharge status: Home / Self Care | Payer: Medicare Other | Source: Ambulatory Visit | Attending: Radiation Oncology | Admitting: Radiation Oncology

## 2020-02-01 DIAGNOSIS — C069 Malignant neoplasm of mouth, unspecified: Secondary | ICD-10-CM | POA: Diagnosis not present

## 2020-02-01 NOTE — Telephone Encounter (Signed)
Virl Son NP at the [facility] called today for clarification on Tramadol order as the daughter did not return with the prescription to DC. Writer review the chart documentation with NP and gave a verbal order from Dr. Grayland Ormond to DC all Tramadol.

## 2020-02-02 ENCOUNTER — Ambulatory Visit
Admission: RE | Admit: 2020-02-02 | Discharge: 2020-02-02 | Disposition: A | Discharge status: Home / Self Care | Payer: Medicare Other | Source: Ambulatory Visit | Attending: Radiation Oncology | Admitting: Radiation Oncology

## 2020-02-02 ENCOUNTER — Ambulatory Visit: Payer: Medicare Other

## 2020-02-03 ENCOUNTER — Inpatient Hospital Stay: Payer: Medicare Other | Admitting: Oncology

## 2020-02-03 ENCOUNTER — Ambulatory Visit
Admission: RE | Admit: 2020-02-03 | Discharge: 2020-02-03 | Disposition: A | Discharge status: Home / Self Care | Payer: Medicare Other | Source: Ambulatory Visit | Attending: Radiation Oncology | Admitting: Radiation Oncology

## 2020-02-03 NOTE — Telephone Encounter (Signed)
Madie Reno NP called today to report that patient's mouth pain has not been relieved by oxycodone. Please fax any new orders to [facility] [place]. [phone].

## 2020-02-04 ENCOUNTER — Emergency Department: Payer: Medicare Other

## 2020-02-04 ENCOUNTER — Other Ambulatory Visit: Payer: Self-pay

## 2020-02-04 ENCOUNTER — Inpatient Hospital Stay
Admission: EM | Admit: 2020-02-04 | Discharge: 2020-02-06 | DRG: 071 | Disposition: A | Discharge status: Nursing Home (Includes ICF) | Payer: Medicare Other | Source: Skilled Nursing Facility | Attending: Internal Medicine | Admitting: Internal Medicine

## 2020-02-04 DIAGNOSIS — I160 Hypertensive urgency: Secondary | ICD-10-CM | POA: Diagnosis present

## 2020-02-04 DIAGNOSIS — Z90710 Acquired absence of both cervix and uterus: Secondary | ICD-10-CM

## 2020-02-04 DIAGNOSIS — I10 Essential (primary) hypertension: Secondary | ICD-10-CM | POA: Diagnosis present

## 2020-02-04 DIAGNOSIS — Z79890 Hormone replacement therapy (postmenopausal): Secondary | ICD-10-CM

## 2020-02-04 DIAGNOSIS — Z20822 Contact with and (suspected) exposure to covid-19: Secondary | ICD-10-CM | POA: Diagnosis present

## 2020-02-04 DIAGNOSIS — M810 Age-related osteoporosis without current pathological fracture: Secondary | ICD-10-CM | POA: Diagnosis present

## 2020-02-04 DIAGNOSIS — F0390 Unspecified dementia without behavioral disturbance: Secondary | ICD-10-CM | POA: Diagnosis present

## 2020-02-04 DIAGNOSIS — E039 Hypothyroidism, unspecified: Secondary | ICD-10-CM | POA: Diagnosis present

## 2020-02-04 DIAGNOSIS — R4182 Altered mental status, unspecified: Secondary | ICD-10-CM | POA: Diagnosis present

## 2020-02-04 DIAGNOSIS — C069 Malignant neoplasm of mouth, unspecified: Secondary | ICD-10-CM | POA: Diagnosis present

## 2020-02-04 DIAGNOSIS — F03911 Unspecified dementia, unspecified severity, with agitation: Secondary | ICD-10-CM

## 2020-02-04 DIAGNOSIS — Z8249 Family history of ischemic heart disease and other diseases of the circulatory system: Secondary | ICD-10-CM

## 2020-02-04 DIAGNOSIS — E785 Hyperlipidemia, unspecified: Secondary | ICD-10-CM | POA: Diagnosis present

## 2020-02-04 DIAGNOSIS — G9341 Metabolic encephalopathy: Secondary | ICD-10-CM | POA: Diagnosis present

## 2020-02-04 DIAGNOSIS — E872 Acidosis: Secondary | ICD-10-CM | POA: Diagnosis present

## 2020-02-04 DIAGNOSIS — M064 Inflammatory polyarthropathy: Secondary | ICD-10-CM | POA: Diagnosis present

## 2020-02-04 DIAGNOSIS — Z888 Allergy status to other drugs, medicaments and biological substances status: Secondary | ICD-10-CM

## 2020-02-04 DIAGNOSIS — Z87891 Personal history of nicotine dependence: Secondary | ICD-10-CM

## 2020-02-04 DIAGNOSIS — Z820 Family history of epilepsy and other diseases of the nervous system: Secondary | ICD-10-CM

## 2020-02-04 DIAGNOSIS — Z79899 Other long term (current) drug therapy: Secondary | ICD-10-CM

## 2020-02-04 DIAGNOSIS — E119 Type 2 diabetes mellitus without complications: Secondary | ICD-10-CM | POA: Diagnosis present

## 2020-02-04 DIAGNOSIS — Z7984 Long term (current) use of oral hypoglycemic drugs: Secondary | ICD-10-CM

## 2020-02-04 DIAGNOSIS — Z881 Allergy status to other antibiotic agents status: Secondary | ICD-10-CM

## 2020-02-04 DIAGNOSIS — H04129 Dry eye syndrome of unspecified lacrimal gland: Secondary | ICD-10-CM | POA: Diagnosis present

## 2020-02-04 DIAGNOSIS — R7989 Other specified abnormal findings of blood chemistry: Secondary | ICD-10-CM

## 2020-02-04 DIAGNOSIS — Z91040 Latex allergy status: Secondary | ICD-10-CM

## 2020-02-04 DIAGNOSIS — F05 Delirium due to known physiological condition: Secondary | ICD-10-CM | POA: Diagnosis present

## 2020-02-04 LAB — CBC WITH DIFFERENTIAL/PLATELET
Abs Immature Granulocytes: 0.06 10*3/uL (ref 0.00–0.07)
Basophils Absolute: 0 10*3/uL (ref 0.0–0.1)
Basophils Relative: 0 %
Eosinophils Absolute: 0 10*3/uL (ref 0.0–0.5)
Eosinophils Relative: 0 %
HCT: 40.3 % (ref 36.0–46.0)
Hemoglobin: 13 g/dL (ref 12.0–15.0)
Immature Granulocytes: 1 %
Lymphocytes Relative: 8 %
Lymphs Abs: 0.8 10*3/uL (ref 0.7–4.0)
MCH: 29.9 pg (ref 26.0–34.0)
MCHC: 32.3 g/dL (ref 30.0–36.0)
MCV: 92.6 fL (ref 80.0–100.0)
Monocytes Absolute: 0.6 10*3/uL (ref 0.1–1.0)
Monocytes Relative: 7 %
Neutro Abs: 8.1 10*3/uL — ABNORMAL HIGH (ref 1.7–7.7)
Neutrophils Relative %: 84 %
Platelets: 342 10*3/uL (ref 150–400)
RBC: 4.35 MIL/uL (ref 3.87–5.11)
RDW: 13 % (ref 11.5–15.5)
WBC: 9.6 10*3/uL (ref 4.0–10.5)
nRBC: 0 % (ref 0.0–0.2)

## 2020-02-04 LAB — COMPREHENSIVE METABOLIC PANEL
ALT: 16 U/L (ref 0–44)
AST: 25 U/L (ref 15–41)
Albumin: 5 g/dL (ref 3.5–5.0)
Alkaline Phosphatase: 95 U/L (ref 38–126)
Anion gap: 15 (ref 5–15)
BUN: 26 mg/dL — ABNORMAL HIGH (ref 8–23)
CO2: 24 mmol/L (ref 22–32)
Calcium: 10.1 mg/dL (ref 8.9–10.3)
Chloride: 103 mmol/L (ref 98–111)
Creatinine, Ser: 0.91 mg/dL (ref 0.44–1.00)
GFR calc Af Amer: >60 mL/min (ref >60)
GFR calc non Af Amer: 56 mL/min — ABNORMAL LOW (ref >60)
Glucose, Bld: 205 mg/dL — ABNORMAL HIGH (ref 70–99)
Potassium: 4.1 mmol/L (ref 3.5–5.1)
Sodium: 142 mmol/L (ref 135–145)
Total Bilirubin: 1 mg/dL (ref 0.3–1.2)
Total Protein: 7.8 g/dL (ref 6.5–8.1)

## 2020-02-04 LAB — LACTIC ACID, PLASMA
Lactic Acid, Venous: 2.7 mmol/L (ref 0.5–1.9)
Lactic Acid, Venous: 4.9 mmol/L (ref 0.5–1.9)
Lactic Acid, Venous: 2.3 mmol/L (ref 0.5–1.9)

## 2020-02-04 MED ORDER — LIDOCAINE VISCOUS HCL 2 % MT SOLN
15.0000 mL | Freq: Once | OROMUCOSAL | Status: AC
  Administered 2020-02-04: 15 mL via OROMUCOSAL
  Filled 2020-02-04: qty 15

## 2020-02-04 MED ORDER — TRAMADOL HCL 50 MG PO TABS
50.0000 mg | ORAL_TABLET | Freq: Once | ORAL | Status: AC
  Administered 2020-02-04: 50 mg via ORAL
  Filled 2020-02-04: qty 1

## 2020-02-04 MED ORDER — SODIUM CHLORIDE 0.9 % IV BOLUS
250.0000 mL | Freq: Once | INTRAVENOUS | Status: AC
  Administered 2020-02-04: 250 mL via INTRAVENOUS

## 2020-02-04 MED ORDER — LACTATED RINGERS IV BOLUS
1000.0000 mL | Freq: Once | INTRAVENOUS | Status: AC
  Administered 2020-02-04: 1000 mL via INTRAVENOUS

## 2020-02-04 MED ORDER — SODIUM CHLORIDE 0.9 % IV BOLUS
500.0000 mL | Freq: Once | INTRAVENOUS | Status: AC
  Administered 2020-02-04: 500 mL via INTRAVENOUS

## 2020-02-04 MED ORDER — ACETAMINOPHEN 325 MG PO TABS
650.0000 mg | ORAL_TABLET | Freq: Once | ORAL | Status: AC
  Administered 2020-02-04: 650 mg via ORAL
  Filled 2020-02-04: qty 2

## 2020-02-04 NOTE — ED Notes (Signed)
Pt ambulated to restroom, hat was placed in toilet, but pt missed the hat, unable to collect urine

## 2020-02-04 NOTE — ED Notes (Addendum)
Called daughter Romie Minus) to ask her about pt- per daughter pt has been getting radiation for mouth cancer and her tramadol got discontinued as a mistake- per daughter pt gets delirium when she has great pain- per daughter she has been losing a lot of weight and has not been having much intake- daughter is concerned about a UTI

## 2020-02-04 NOTE — ED Notes (Signed)
RN attempted to redirect patient several times after patient was able to get out of bed and run down hall. After several unsuccessful attempts to redirect and orient patient RN ended up physically escorting patient with [employer] officer and placing patient on bed. Neither patient, staff or [employer] officer were injured. Patient has hx of dementia.

## 2020-02-04 NOTE — ED Notes (Addendum)
Daughter Romie Minus) given update on pt condition

## 2020-02-04 NOTE — ED Notes (Signed)
Pt continues to try to get out of bed, difficult to redirect. Charge nurse notified. Non skid socks placed. ED tech sitting with patient at this time.

## 2020-02-04 NOTE — ED Triage Notes (Signed)
Pt states she is here for back pain that goes all the way down to her legs but EMS states that she is here for pain in her mouth- pt has a hx of dementia and it is difficult to obtain information from pt- pt states the mouth pain is in her right cheek that she got a special mouthwash for but it is not helping

## 2020-02-04 NOTE — Progress Notes (Deleted)
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  INTERVAL HISTORY: Patient returns to clinic today for further evaluation and discussion of her PET scan results.  She does not complain of dysphagia.  She has no neurologic complaints.  She denies any recent fevers or illnesses.  She has a fair appetite, but denies weight loss.  She has no chest pain, shortness of breath, cough, or hemoptysis.  She denies any nausea, vomiting, constipation, or diarrhea.  She has no urinary complaints.  Patient offers no further specific complaints today.  REVIEW OF SYSTEMS:   Review of Systems  Constitutional: Negative.  Negative for fever, malaise/fatigue and weight loss.  Respiratory: Negative.  Negative for cough, hemoptysis and shortness of breath.   Cardiovascular: Negative.  Negative for chest pain and leg swelling.  Gastrointestinal: Negative.  Negative for abdominal pain, blood in stool and melena.  Genitourinary: Negative.  Negative for hematuria.  Musculoskeletal: Negative.   Skin: Negative.  Negative for rash.  Neurological: Negative.  Negative for dizziness, focal weakness, weakness and headaches.  Psychiatric/Behavioral: Positive for memory loss. The patient is not nervous/anxious.     As per HPI. Otherwise, a complete review of systems is negative.  PAST MEDICAL HISTORY: Past Medical History:  Diagnosis Date  . Diabetes mellitus ([place])    diet controlled  . Diabetes mellitus without complication ([place])   . Dry eye syndrome     . Hyperlipidemia, unspecified   . Hypertension   . Inflammatory arthritis   . Osteoporosis   . Postmenopausal     PAST SURGICAL HISTORY: Past Surgical History:  Procedure Laterality Date  . ABDOMINAL HYSTERECTOMY    . APPENDECTOMY    . CAPSULOTOMY     OS  . CATARACT EXTRACTION W/PHACO Right 08/2008   crystalens 24.0D  . CATARACT EXTRACTION W/PHACO Left 01/18/2009   Crystalens 24.5D    FAMILY HISTORY: Family History  Problem Relation Age of Onset  . Alzheimer's disease Mother   . Hypertension Father   . Alzheimer's disease Sister   . Alzheimer's disease Sister     ADVANCED DIRECTIVES (Y/N):  N  HEALTH MAINTENANCE: Social History   Tobacco Use  . Smoking status: Former Smoker    Types: Cigarettes    Quit date: 05/26/1968    Years since quitting: 51.7  . Smokeless tobacco: Never Used  Vaping Use  . Vaping Use: Never used  Substance Use Topics  . Alcohol use: No  . Drug use: No     Colonoscopy:  PAP:  Bone density:  Lipid panel:  Allergies  Allergen Reactions  . Epinephrine Other (See Comments)    Other Reaction: Passed out after dental inject  . Latex Other (See Comments)    Other Reaction: angioedema, mouth burns  . Levofloxacin     Other reaction(s): Hallucination  . Other Other (See Comments)    Uncoded Allergy. Allergen: ENVIRONMENTAL ALLERGIES    Current Outpatient Medications  Medication Sig Dispense Refill  . acetaminophen (TYLENOL ARTHRITIS PAIN) 650 MG CR tablet Take 1,300 mg by mouth 2 (two) times daily.     Marland Kitchen atorvastatin (LIPITOR) 10  MG tablet Take 10 mg by mouth daily.    Marland Kitchen azithromycin (ZITHROMAX) 250 MG tablet Take 250 mg by mouth as directed.    Marland Kitchen dexamethasone (DECADRON) 0.5 MG/5ML solution Take 0.5 mg by mouth See admin instructions. Rinse 69ml orally for 1 minute and spit out 4 times daily    . enalapril (VASOTEC) 20 MG tablet Take 20 mg by mouth 2 (two) times daily.     . fluconazole (DIFLUCAN) 100 MG tablet Take 2 tablets (200 mg  total) by mouth daily for 1 day, THEN 1 tablet (100 mg total) daily for 13 days. 15 tablet 0  . guaiFENesin (ROBITUSSIN) 100 MG/5ML liquid Take 200 mg by mouth 3 (three) times daily as needed for cough.     . levothyroxine (SYNTHROID, LEVOTHROID) 50 MCG tablet Take 50 mcg by mouth daily before breakfast.     . magic mouthwash w/lidocaine SOLN Take 5 mLs by mouth 4 (four) times daily as needed for mouth pain. Swish and swallow 280 mL 0  . magnesium hydroxide (MILK OF MAGNESIA) 400 MG/5ML suspension Take 30 mLs by mouth daily as needed for moderate constipation.     . metFORMIN (GLUCOPHAGE) 500 MG tablet Take 500 mg by mouth 2 (two) times daily with a meal.     . oxyCODONE (OXY IR/ROXICODONE) 5 MG immediate release tablet Take 1 tablet (5 mg total) by mouth every 6 (six) hours as needed for severe pain (unrelieved by tylenol). 30 tablet 0  . potassium chloride (K-DUR,KLOR-CON) 10 MEQ tablet Take 20 mEq by mouth daily.     . rivastigmine (EXELON) 9.5 mg/24hr Place 9.5 mg onto the skin daily. 0745    . sucralfate (CARAFATE) 1 g tablet Take 1 tablet (1 g total) by mouth 3 (three) times daily. Dissolve in 3-4 tbsp warm water, swish and swallow. 90 tablet 1   No current facility-administered medications for this visit.    OBJECTIVE: There were no vitals filed for this visit.   There is no height or weight on file to calculate BMI.    ECOG FS:1 - Symptomatic but completely ambulatory  General: Well-developed, well-nourished, no acute distress. Eyes: Pink conjunctiva, anicteric sclera. HEENT: Normocephalic, moist mucous membranes. Ulcerated lesion noted on the right buccal membrane.  No palpable lymphadenopathy. Lungs: No audible wheezing or coughing. Heart: Regular rate and rhythm. Abdomen: Soft, nontender, no obvious distention. Musculoskeletal: No edema, cyanosis, or clubbing. Neuro: Alert, answering all questions appropriately. Cranial nerves grossly intact. Skin: No rashes or petechiae  noted. Psych: Normal affect.  LAB RESULTS:  Lab Results  Component Value Date   NA 142 01/27/2020   K 3.6 01/27/2020   CL 104 01/27/2020   CO2 25 01/27/2020   GLUCOSE 134 (H) 01/27/2020   BUN 23 01/27/2020   CREATININE 0.77 01/27/2020   CALCIUM 9.3 01/27/2020   PROT 7.2 01/27/2020   ALBUMIN 4.1 01/27/2020   AST 21 01/27/2020   ALT 13 01/27/2020   ALKPHOS 93 01/27/2020   BILITOT 0.7 01/27/2020   GFRNONAA >60 01/27/2020   GFRAA >60 01/27/2020    Lab Results  Component Value Date   WBC 8.2 01/31/2020   NEUTROABS 4.0 01/27/2020   HGB 12.0 01/31/2020   HCT 36.5 01/31/2020   MCV 90.8 01/31/2020   PLT 272 01/31/2020     STUDIES: No results found.  ASSESSMENT: Squamous cell carcinoma of oral cavity.  PLAN:    1. Squamous cell carcinoma of oral cavity: PET scan results from November 22, 2027 reviewed  independently and reported as above with no obvious metastatic disease.  Given her advanced age and decreased memory, do not recommend chemotherapy for a localized lesion.  Patient has an appointment with radiation oncology later this morning for initial consultation and treatment planning.  Patient will follow up at the end of her XRT for further evaluation and discussion of future diagnostic planning.    I spent a total of 30 minutes reviewing chart data, face-to-face evaluation with the patient, counseling and coordination of care as detailed above.   Patient expressed understanding and was in agreement with this plan. She also understands that She can call clinic at any time with any questions, concerns, or complaints.   Cancer Staging No matching staging information was found for the patient.  Lloyd Huger, MD   02/04/2020 9:37 AM

## 2020-02-04 NOTE — ED Provider Notes (Signed)
[facility] Emergency Department Provider Note    First MD Initiated Contact with Patient 02/04/20 1901     (approximate)  I have reviewed the triage vital signs and the nursing notes.   HISTORY  Chief Complaint Generalized Body Aches  Level V Caveat:  dementia  HPI Katie Logan is a 86 y.o. female below listed past medical history presents to the ER for evaluation of mouth pain.  Patient has known squamous cell carcinoma of the mouth and has been undergoing radiation therapy with recent treatment yesterday.  Has had some medication changes for pain control where she typically was on tramadol and over the past few days was placed on Roxicodone and has since been having worsening sundowning.  Has had some decreased p.o. intake but otherwise been okay with fluids.  Improved was sent from memory care unit due to worsening agitation and confusion today.    Past Medical History:  Diagnosis Date  . Diabetes mellitus ([place])    diet controlled  . Diabetes mellitus without complication ([place])   . Dry eye syndrome   . Hyperlipidemia, unspecified   . Hypertension   . Inflammatory arthritis   . Osteoporosis   . Postmenopausal    Family History  Problem Relation Age of Onset  . Alzheimer's disease Mother   . Hypertension Father   . Alzheimer's disease Sister   . Alzheimer's disease Sister    Past Surgical History:  Procedure Laterality Date  . ABDOMINAL HYSTERECTOMY    . APPENDECTOMY    . CAPSULOTOMY     OS  . CATARACT EXTRACTION W/PHACO Right 08/2008   crystalens 24.0D  . CATARACT EXTRACTION [facility] Left 01/18/2009   Crystalens 24.5D   Patient Active Problem List   Diagnosis Date Noted  . AMS (altered mental status) 02/04/2020  . Squamous cell carcinoma of oral cavity ([place]) 11/10/2019  . Non-insulin dependent type 2 diabetes mellitus ([place]) 01/25/2018  . Dyslipidemia associated with type 2 diabetes mellitus ([place]) 01/25/2018  . Vitamin D deficiency  01/25/2018  . Living in assisted living 01/04/2018  . Alzheimer's disease with late onset (CODE) ([place]) 01/14/2017  . Essential (primary) hypertension 01/14/2017  . Hypothyroidism 01/14/2017  . Hypokalemia 01/14/2017  . Anxiety disorder 01/14/2017  . Osteoarthritis 01/14/2017  . Healthcare maintenance 01/06/2017  . History of recurrent UTI (urinary tract infection) 11/10/2016  . Hearing loss 01/13/2013  . Precancerous skin lesion 08/06/2012  . Pseudophakia of both eyes 08/06/2012      Prior to Admission medications   Medication Sig Start Date End Date Taking? Authorizing Provider  acetaminophen (TYLENOL ARTHRITIS PAIN) 650 MG CR tablet Take 1,300 mg by mouth 2 (two) times daily.    Yes [provider]  atorvastatin (LIPITOR) 10 MG tablet Take 10 mg by mouth daily.   Yes [provider]  dexamethasone (DECADRON) 0.5 MG/5ML solution Take 0.5 mg by mouth See admin instructions. Rinse 45ml orally for 1 minute and spit out 4 times daily as needed   Yes [provider]  enalapril (VASOTEC) 20 MG tablet Take 20 mg by mouth 2 (two) times daily.    Yes [provider]  fluconazole (DIFLUCAN) 100 MG tablet Take 2 tablets (200 mg total) by mouth daily for 1 day, THEN 1 tablet (100 mg total) daily for 13 days. 01/27/20 02/10/20 Yes Verlon Au, NP  levothyroxine (SYNTHROID, LEVOTHROID) 50 MCG tablet Take 50 mcg by mouth daily before breakfast.    Yes [provider]  magnesium hydroxide (MILK OF MAGNESIA) 400 MG/5ML suspension Take 30 mLs by mouth daily as needed for moderate constipation.    Yes [provider]  metFORMIN (GLUCOPHAGE) 500 MG tablet Take 500 mg by mouth 2 (two) times daily with a meal.    Yes [provider]  oxyCODONE (OXY IR/ROXICODONE) 5 MG immediate release tablet Take 1 tablet (5 mg total) by mouth every 6 (six) hours as needed for severe pain (unrelieved by tylenol). 01/27/20  Yes Verlon Au, NP  potassium  chloride (K-DUR,KLOR-CON) 10 MEQ tablet Take 20 mEq by mouth daily.    Yes [provider]  rivastigmine (EXELON) 9.5 mg/24hr Place 9.5 mg onto the skin daily. 0745   Yes [provider]  sucralfate (CARAFATE) 1 g tablet Take 1 tablet (1 g total) by mouth 3 (three) times daily. Dissolve in 3-4 tbsp warm water, swish and swallow. 12/20/19  Yes Chrystal, Eulas Post, MD  azithromycin (ZITHROMAX) 250 MG tablet Take 250 mg by mouth as directed. Patient not taking: Reported on 02/04/2020 10/29/19   [provider]  guaiFENesin (ROBITUSSIN) 100 MG/5ML liquid Take 200 mg by mouth 3 (three) times daily as needed for cough.  Patient not taking: Reported on 02/04/2020    [provider]  magic mouthwash w/lidocaine SOLN Take 5 mLs by mouth 4 (four) times daily as needed for mouth pain. Swish and swallow 01/27/20   Verlon Au, NP    Allergies Epinephrine, Latex, Levofloxacin, and Other    Social History Social History   Tobacco Use  . Smoking status: Former Smoker    Types: Cigarettes    Quit date: 05/26/1968    Years since quitting: 51.7  . Smokeless tobacco: Never Used  Vaping Use  . Vaping Use: Never used  Substance Use Topics  . Alcohol use: No  . Drug use: No    Review of Systems Patient denies headaches, rhinorrhea, blurry vision, numbness, shortness of breath, chest pain, edema, cough, abdominal pain, nausea, vomiting, diarrhea, dysuria, fevers, rashes or hallucinations unless otherwise stated above in HPI. ____________________________________________   PHYSICAL EXAM:  VITAL SIGNS: Vitals:   02/04/20 1724 02/04/20 2237  BP: (!) 186/79 (!) 161/86  Pulse: 98 94  Resp: 20 18  Temp: 98.8 F (37.1 C)   SpO2: 98% 100%    Constitutional: Alert and oriented.  Eyes: Conjunctivae are normal.  Head: Atraumatic. Nose: No congestion/rhinnorhea. Mouth/Throat: Mucous membranes are moist.   Neck: No stridor. Painless ROM.  Cardiovascular: Normal rate,  regular rhythm. Grossly normal heart sounds.  Good peripheral circulation. Respiratory: Normal respiratory effort.  No retractions. Lungs CTAB. Gastrointestinal: Soft and nontender. No distention. No abdominal bruits. No CVA tenderness. Genitourinary:  Musculoskeletal: No lower extremity tenderness nor edema.  No joint effusions. Neurologic:  Normal speech and language. No gross focal neurologic deficits are appreciated. No facial droop Skin:  Skin is warm, dry and intact. No rash noted. Psychiatric: Episodes of agitation and confusion trying to escape from staff while in triage but once brought back to the ER room is easily redirectable calm and cooperative.  ____________________________________________   LABS (all labs ordered are listed, but only abnormal results are displayed)  Results for orders placed or performed during the hospital encounter of 02/04/20 (from the past 24 hour(s))  Lactic acid, plasma     Status: Abnormal   Collection Time: 02/04/20  3:10 PM  Result Value Ref Range   Lactic Acid, Venous 2.7 (HH) 0.5 - 1.9 mmol/L  Comprehensive metabolic  panel     Status: Abnormal   Collection Time: 02/04/20  3:10 PM  Result Value Ref Range   Sodium 142 135 - 145 mmol/L   Potassium 4.1 3.5 - 5.1 mmol/L   Chloride 103 98 - 111 mmol/L   CO2 24 22 - 32 mmol/L   Glucose, Bld 205 (H) 70 - 99 mg/dL   BUN 26 (H) 8 - 23 mg/dL   Creatinine, Ser 0.91 0.44 - 1.00 mg/dL   Calcium 10.1 8.9 - 10.3 mg/dL   Total Protein 7.8 6.5 - 8.1 g/dL   Albumin 5.0 3.5 - 5.0 g/dL   AST 25 15 - 41 U/L   ALT 16 0 - 44 U/L   Alkaline Phosphatase 95 38 - 126 U/L   Total Bilirubin 1.0 0.3 - 1.2 mg/dL   GFR calc non Af Amer 56 (L) >60 mL/min   GFR calc Af Amer >60 >60 mL/min   Anion gap 15 5 - 15  CBC with Differential     Status: Abnormal   Collection Time: 02/04/20  3:10 PM  Result Value Ref Range   WBC 9.6 4.0 - 10.5 K/uL   RBC 4.35 3.87 - 5.11 MIL/uL   Hemoglobin 13.0 12.0 - 15.0 g/dL   HCT 40.3  36 - 46 %   MCV 92.6 80.0 - 100.0 fL   MCH 29.9 26.0 - 34.0 pg   MCHC 32.3 30.0 - 36.0 g/dL   RDW 13.0 11.5 - 15.5 %   Platelets 342 150 - 400 K/uL   nRBC 0.0 0.0 - 0.2 %   Neutrophils Relative % 84 %   Neutro Abs 8.1 (H) 1.7 - 7.7 K/uL   Lymphocytes Relative 8 %   Lymphs Abs 0.8 0.7 - 4.0 K/uL   Monocytes Relative 7 %   Monocytes Absolute 0.6 0 - 1 K/uL   Eosinophils Relative 0 %   Eosinophils Absolute 0.0 0 - 0 K/uL   Basophils Relative 0 %   Basophils Absolute 0.0 0 - 0 K/uL   Immature Granulocytes 1 %   Abs Immature Granulocytes 0.06 0.00 - 0.07 K/uL  Lactic acid, plasma     Status: Abnormal   Collection Time: 02/04/20  7:49 PM  Result Value Ref Range   Lactic Acid, Venous 4.9 (HH) 0.5 - 1.9 mmol/L  Lactic acid, plasma     Status: Abnormal   Collection Time: 02/04/20  9:59 PM  Result Value Ref Range   Lactic Acid, Venous 2.3 (HH) 0.5 - 1.9 mmol/L   ____________________________________________ ____________________________________________  RADIOLOGY  I personally reviewed all radiographic images ordered to evaluate for the above acute complaints and reviewed radiology reports and findings.  These findings were personally discussed with the patient.  Please see medical record for radiology report.  ____________________________________________   PROCEDURES  Procedure(s) performed:  Procedures    Critical Care performed: no ____________________________________________   INITIAL IMPRESSION / ASSESSMENT AND PLAN / ED COURSE  Pertinent labs & imaging results that were available during my care of the patient were reviewed by me and considered in my medical decision making (see chart for details).   DDX: medication effect, dehydration, sepsis, mucositis  Katie Logan is a 86 y.o. who presents to the ED with presentation as described above.  Her blood work ordered out of triage due to some agitation complaint of worsening mouth pain.  Mildly elevated lactate and was  given some IV fluids.  Did have prolonged wait time and waiting room due to  ER boarding.  Was having some agitation but not combative.  She did have episode where she was trying to run from staff when being brought to the main ER for evaluation.  On examination she does have some evidence of mucositis.  No findings to suggest [place]'s.  Discussed case with the patient's daughter who states that she has had some worsening sundowning since being changed onto her oxycodone and stopping her tramadol.  Apparently did much better with tramadol for pain control.  Will trial that.  I repeated a lactate to the first 1 being mildly elevated make sure is downtrending however the repeat was even more elevated however I suspect this is secondary to her exertion while trying to evade staff.  She is currently calm cooperative and hemodynamically stable.  Does not appear to be showing any signs of sepsis.  Will give additional IV fluids encourage p.o. and repeat.  Family would prefer for her to be sent back to her memory care facility.  Clinical Course as of Feb 04 2  Sat Feb 04, 2020  2159 Patient up ambulating in the hall in no acute distress appears very calm and relaxed.  Very cooperative.  She did provide a urine sample.   [PR]  2255 Was unable to provide urine sample.  Repeat lactate still elevated despite aggressive IV hydration.  I do not think that she is septic but at this point I will discuss with hospitalist for admission for additional observation IV hydration.   [PR]  Sun Feb 05, 2020  0001 Patient up and ambulating about ER now.  Is having to be redirected.  Will give additional tramadol as that did seem to calm her.   [PR]    Clinical Course User Index [PR] Merlyn Lot, MD    The patient was evaluated in Emergency Department today for the symptoms described in the history of present illness. He/she was evaluated in the context of the global COVID-19 pandemic, which necessitated  consideration that the patient might be at risk for infection with the SARS-CoV-2 virus that causes COVID-19. Institutional protocols and algorithms that pertain to the evaluation of patients at risk for COVID-19 are in a state of rapid change based on information released by regulatory bodies including the CDC and federal and state organizations. These policies and algorithms were followed during the patient's care in the ED.  As part of my medical decision making, I reviewed the following data within the [place] notes reviewed and incorporated, Labs reviewed, notes from prior ED visits and [place] Controlled Substance Database   ____________________________________________   FINAL CLINICAL IMPRESSION(S) / ED DIAGNOSES  Final diagnoses:  Agitation due to dementia ([place])  Elevated lactic acid level      NEW MEDICATIONS STARTED DURING THIS VISIT:  New Prescriptions   No medications on file     Note:  This document was prepared using Dragon voice recognition software and may include unintentional dictation errors.    Merlyn Lot, MD 02/05/20 0003

## 2020-02-04 NOTE — ED Notes (Signed)
After coaxing pt to bed pt went down hallway screaming with IV in her arm.  [employer] that is with another pt attempting to redirect pt.  Pt not able to be redirected at this time.  Pt remains yelling and in hallway near family room; hx dementia.  Dr Quentin Cornwall informed, no new orders.

## 2020-02-04 NOTE — ED Notes (Signed)
One set cultures sent with blood

## 2020-02-04 NOTE — ED Notes (Addendum)
Pt began getting up and down from wheelchair in lobby, pt trying to leave, pt confused states she is in [place], advised patient that she is waiting to be seen by EDP and that she is at a hospital in [place]. Pt has hx of dementia.  Pt unable to be redirected at this time, called the Charge RN for bed placement as patient is unable to obey commands.  Pt's daughter called by Jenetta Downer RN to see if she could direct her.  Security provided some assistance in lobby.  Pt trying to pull out IV as well. Pt finally able to be directed to B side of ED, but refused to get on stretcher, pt then proceeded to swiftly walk to down the hall towards the family waiting room. Stretcher brought to hallway and [employer] officer in hallway with another patient Ali Lowe RN helped to get the patient on the bed.    Dr. Quentin Cornwall aware, but did not provide any orders at this time

## 2020-02-04 NOTE — ED Notes (Signed)
IVF infusing, almost complete, will draw repeat lactic after fluids completed.

## 2020-02-04 NOTE — ED Notes (Signed)
Pt to ED via [facility] for mouth pain x 1 month. Pt has hx/o mouth cancer. Per EMS pt told them she did not want to be at her facility anymore, she wanted to come to the hospital so we could take care of her mouth pain. Pt is in NAD  Hx/o dementia per EMS

## 2020-02-05 ENCOUNTER — Observation Stay: Payer: Medicare Other

## 2020-02-05 DIAGNOSIS — Z7984 Long term (current) use of oral hypoglycemic drugs: Secondary | ICD-10-CM | POA: Diagnosis not present

## 2020-02-05 DIAGNOSIS — E039 Hypothyroidism, unspecified: Secondary | ICD-10-CM | POA: Diagnosis present

## 2020-02-05 DIAGNOSIS — C069 Malignant neoplasm of mouth, unspecified: Secondary | ICD-10-CM | POA: Diagnosis present

## 2020-02-05 DIAGNOSIS — R7989 Other specified abnormal findings of blood chemistry: Secondary | ICD-10-CM | POA: Diagnosis not present

## 2020-02-05 DIAGNOSIS — G9341 Metabolic encephalopathy: Secondary | ICD-10-CM | POA: Diagnosis present

## 2020-02-05 DIAGNOSIS — Z820 Family history of epilepsy and other diseases of the nervous system: Secondary | ICD-10-CM | POA: Diagnosis not present

## 2020-02-05 DIAGNOSIS — E119 Type 2 diabetes mellitus without complications: Secondary | ICD-10-CM | POA: Diagnosis present

## 2020-02-05 DIAGNOSIS — I160 Hypertensive urgency: Secondary | ICD-10-CM | POA: Diagnosis present

## 2020-02-05 DIAGNOSIS — R4182 Altered mental status, unspecified: Secondary | ICD-10-CM | POA: Diagnosis not present

## 2020-02-05 DIAGNOSIS — Z90710 Acquired absence of both cervix and uterus: Secondary | ICD-10-CM | POA: Diagnosis not present

## 2020-02-05 DIAGNOSIS — F0390 Unspecified dementia without behavioral disturbance: Secondary | ICD-10-CM | POA: Diagnosis present

## 2020-02-05 DIAGNOSIS — E785 Hyperlipidemia, unspecified: Secondary | ICD-10-CM | POA: Diagnosis present

## 2020-02-05 DIAGNOSIS — E872 Acidosis: Secondary | ICD-10-CM | POA: Diagnosis present

## 2020-02-05 DIAGNOSIS — F0391 Unspecified dementia with behavioral disturbance: Secondary | ICD-10-CM | POA: Diagnosis not present

## 2020-02-05 DIAGNOSIS — Z888 Allergy status to other drugs, medicaments and biological substances status: Secondary | ICD-10-CM | POA: Diagnosis not present

## 2020-02-05 DIAGNOSIS — Z91040 Latex allergy status: Secondary | ICD-10-CM | POA: Diagnosis not present

## 2020-02-05 DIAGNOSIS — Z87891 Personal history of nicotine dependence: Secondary | ICD-10-CM | POA: Diagnosis not present

## 2020-02-05 DIAGNOSIS — Z79899 Other long term (current) drug therapy: Secondary | ICD-10-CM | POA: Diagnosis not present

## 2020-02-05 DIAGNOSIS — I10 Essential (primary) hypertension: Secondary | ICD-10-CM | POA: Diagnosis present

## 2020-02-05 DIAGNOSIS — M064 Inflammatory polyarthropathy: Secondary | ICD-10-CM | POA: Diagnosis present

## 2020-02-05 DIAGNOSIS — M810 Age-related osteoporosis without current pathological fracture: Secondary | ICD-10-CM | POA: Diagnosis present

## 2020-02-05 DIAGNOSIS — Z8249 Family history of ischemic heart disease and other diseases of the circulatory system: Secondary | ICD-10-CM | POA: Diagnosis not present

## 2020-02-05 DIAGNOSIS — Z20822 Contact with and (suspected) exposure to covid-19: Secondary | ICD-10-CM | POA: Diagnosis present

## 2020-02-05 DIAGNOSIS — Z79890 Hormone replacement therapy (postmenopausal): Secondary | ICD-10-CM | POA: Diagnosis not present

## 2020-02-05 DIAGNOSIS — R52 Pain, unspecified: Secondary | ICD-10-CM | POA: Diagnosis present

## 2020-02-05 DIAGNOSIS — H04129 Dry eye syndrome of unspecified lacrimal gland: Secondary | ICD-10-CM | POA: Diagnosis present

## 2020-02-05 DIAGNOSIS — Z881 Allergy status to other antibiotic agents status: Secondary | ICD-10-CM | POA: Diagnosis not present

## 2020-02-05 DIAGNOSIS — F05 Delirium due to known physiological condition: Secondary | ICD-10-CM | POA: Diagnosis present

## 2020-02-05 LAB — BASIC METABOLIC PANEL
Anion gap: 10 (ref 5–15)
BUN: 20 mg/dL (ref 8–23)
CO2: 24 mmol/L (ref 22–32)
Calcium: 9.1 mg/dL (ref 8.9–10.3)
Chloride: 103 mmol/L (ref 98–111)
Creatinine, Ser: 0.71 mg/dL (ref 0.44–1.00)
GFR calc Af Amer: >60 mL/min (ref >60)
GFR calc non Af Amer: >60 mL/min (ref >60)
Glucose, Bld: 123 mg/dL — ABNORMAL HIGH (ref 70–99)
Potassium: 4.2 mmol/L (ref 3.5–5.1)
Sodium: 137 mmol/L (ref 135–145)

## 2020-02-05 LAB — GLUCOSE, CAPILLARY
Glucose-Capillary: 115 mg/dL — ABNORMAL HIGH (ref 70–99)
Glucose-Capillary: 161 mg/dL — ABNORMAL HIGH (ref 70–99)
Glucose-Capillary: 117 mg/dL — ABNORMAL HIGH (ref 70–99)
Glucose-Capillary: 153 mg/dL — ABNORMAL HIGH (ref 70–99)

## 2020-02-05 LAB — URINALYSIS, COMPLETE (UACMP) WITH MICROSCOPIC
Bacteria, UA: NONE SEEN
Bilirubin Urine: NEGATIVE
Glucose, UA: NEGATIVE mg/dL
Hgb urine dipstick: NEGATIVE
Ketones, ur: NEGATIVE mg/dL
Leukocytes,Ua: NEGATIVE
Nitrite: NEGATIVE
Protein, ur: NEGATIVE mg/dL
Specific Gravity, Urine: 1.008 (ref 1.005–1.030)
pH: 6 (ref 5.0–8.0)

## 2020-02-05 LAB — CBC
HCT: 32.9 % — ABNORMAL LOW (ref 36.0–46.0)
Hemoglobin: 11.1 g/dL — ABNORMAL LOW (ref 12.0–15.0)
MCH: 30.2 pg (ref 26.0–34.0)
MCHC: 33.7 g/dL (ref 30.0–36.0)
MCV: 89.6 fL (ref 80.0–100.0)
Platelets: 244 10*3/uL (ref 150–400)
RBC: 3.67 MIL/uL — ABNORMAL LOW (ref 3.87–5.11)
RDW: 13.2 % (ref 11.5–15.5)
WBC: 7.5 10*3/uL (ref 4.0–10.5)
nRBC: 0 % (ref 0.0–0.2)

## 2020-02-05 LAB — SARS CORONAVIRUS 2 BY RT PCR (HOSPITAL ORDER, PERFORMED IN ~~LOC~~ HOSPITAL LAB): SARS Coronavirus 2: NEGATIVE

## 2020-02-05 LAB — HEMOGLOBIN A1C
Hgb A1c MFr Bld: 5.8 % — ABNORMAL HIGH (ref 4.8–5.6)
Mean Plasma Glucose: 119.76 mg/dL

## 2020-02-05 MED ORDER — SODIUM CHLORIDE 0.9 % IV SOLN: INTRAVENOUS | Status: DC

## 2020-02-05 MED ORDER — RIVASTIGMINE 9.5 MG/24HR TD PT24
9.5000 mg | MEDICATED_PATCH | Freq: Every day | TRANSDERMAL | Status: DC
  Administered 2020-02-05: 9.5 mg via TRANSDERMAL
  Filled 2020-02-05 (×2): qty 1

## 2020-02-05 MED ORDER — MAGNESIUM HYDROXIDE 400 MG/5ML PO SUSP: 30.0000 mL | Freq: Every day | ORAL | Status: DC | PRN

## 2020-02-05 MED ORDER — DROPERIDOL 2.5 MG/ML IJ SOLN
2.5000 mg | Freq: Once | INTRAMUSCULAR | Status: AC
  Administered 2020-02-05: 2.5 mg via INTRAVENOUS

## 2020-02-05 MED ORDER — ONDANSETRON HCL 4 MG PO TABS: 4.0000 mg | ORAL_TABLET | Freq: Four times a day (QID) | ORAL | Status: DC | PRN

## 2020-02-05 MED ORDER — ACETAMINOPHEN 650 MG RE SUPP: 650.0000 mg | Freq: Four times a day (QID) | RECTAL | Status: DC | PRN

## 2020-02-05 MED ORDER — SUCRALFATE 1 G PO TABS
1.0000 g | ORAL_TABLET | Freq: Three times a day (TID) | ORAL | Status: DC
  Administered 2020-02-05 (×2): 1 g via ORAL
  Filled 2020-02-05 (×2): qty 1

## 2020-02-05 MED ORDER — HALOPERIDOL LACTATE 5 MG/ML IJ SOLN
1.0000 mg | Freq: Four times a day (QID) | INTRAMUSCULAR | Status: DC | PRN
  Administered 2020-02-05 – 2020-02-06 (×2): 1 mg via INTRAMUSCULAR
  Filled 2020-02-05 (×2): qty 1

## 2020-02-05 MED ORDER — ENALAPRIL MALEATE 10 MG PO TABS
20.0000 mg | ORAL_TABLET | Freq: Two times a day (BID) | ORAL | Status: DC
  Administered 2020-02-05 (×3): 20 mg via ORAL
  Filled 2020-02-05 (×3): qty 2

## 2020-02-05 MED ORDER — LABETALOL HCL 5 MG/ML IV SOLN: 20.0000 mg | INTRAVENOUS | Status: DC | PRN

## 2020-02-05 MED ORDER — POTASSIUM CHLORIDE CRYS ER 20 MEQ PO TBCR: 20.0000 meq | EXTENDED_RELEASE_TABLET | Freq: Every day | ORAL | Status: DC

## 2020-02-05 MED ORDER — MAGIC MOUTHWASH
5.0000 mL | Freq: Four times a day (QID) | ORAL | Status: DC | PRN
  Administered 2020-02-05: 5 mL via ORAL
  Filled 2020-02-05 (×2): qty 5

## 2020-02-05 MED ORDER — ATORVASTATIN CALCIUM 20 MG PO TABS
10.0000 mg | ORAL_TABLET | Freq: Every day | ORAL | Status: DC
  Administered 2020-02-05: 10 mg via ORAL
  Filled 2020-02-05: qty 1

## 2020-02-05 MED ORDER — ACETAMINOPHEN 325 MG PO TABS: 650.0000 mg | ORAL_TABLET | Freq: Four times a day (QID) | ORAL | Status: DC | PRN

## 2020-02-05 MED ORDER — SODIUM CHLORIDE 0.9 % IV SOLN: Freq: Once | INTRAVENOUS | Status: DC

## 2020-02-05 MED ORDER — OXYCODONE HCL 5 MG PO TABS
5.0000 mg | ORAL_TABLET | Freq: Four times a day (QID) | ORAL | Status: DC | PRN
  Administered 2020-02-05 – 2020-02-06 (×4): 5 mg via ORAL
  Filled 2020-02-05 (×4): qty 1

## 2020-02-05 MED ORDER — LEVOTHYROXINE SODIUM 50 MCG PO TABS
50.0000 ug | ORAL_TABLET | Freq: Every day | ORAL | Status: DC
  Administered 2020-02-05: 50 ug via ORAL
  Filled 2020-02-05: qty 1

## 2020-02-05 MED ORDER — ONDANSETRON HCL 4 MG/2ML IJ SOLN: 4.0000 mg | Freq: Four times a day (QID) | INTRAMUSCULAR | Status: DC | PRN

## 2020-02-05 MED ORDER — HALOPERIDOL LACTATE 5 MG/ML IJ SOLN
INTRAMUSCULAR | Status: AC
  Administered 2020-02-05: 2.5 mg via INTRAVENOUS
  Filled 2020-02-05: qty 1

## 2020-02-05 MED ORDER — ASPIRIN EC 81 MG PO TBEC
81.0000 mg | DELAYED_RELEASE_TABLET | Freq: Every day | ORAL | Status: DC
  Administered 2020-02-05: 81 mg via ORAL
  Filled 2020-02-05: qty 1

## 2020-02-05 MED ORDER — HALOPERIDOL LACTATE 5 MG/ML IJ SOLN: 2.5000 mg | Freq: Once | INTRAMUSCULAR | Status: AC

## 2020-02-05 MED ORDER — INSULIN ASPART 100 UNIT/ML ~~LOC~~ SOLN
0.0000 [IU] | Freq: Three times a day (TID) | SUBCUTANEOUS | Status: DC
  Administered 2020-02-05 (×2): 2 [IU] via SUBCUTANEOUS
  Filled 2020-02-05 (×2): qty 1

## 2020-02-05 MED ORDER — HALOPERIDOL LACTATE 5 MG/ML IJ SOLN: 2.5000 mg | Freq: Once | INTRAMUSCULAR | Status: DC

## 2020-02-05 MED ORDER — TRAZODONE HCL 50 MG PO TABS
25.0000 mg | ORAL_TABLET | Freq: Every evening | ORAL | Status: DC | PRN
  Administered 2020-02-05: 25 mg via ORAL
  Filled 2020-02-05 (×2): qty 1

## 2020-02-05 MED ORDER — ENOXAPARIN SODIUM 40 MG/0.4ML ~~LOC~~ SOLN
40.0000 mg | SUBCUTANEOUS | Status: DC
  Administered 2020-02-05: 40 mg via SUBCUTANEOUS
  Filled 2020-02-05: qty 0.4

## 2020-02-05 NOTE — ED Notes (Signed)
Pt resting in NAD. Pt swallows pills with water no issues. Reinforced with pt that keeping IV in is a good idea.

## 2020-02-05 NOTE — ED Notes (Signed)
Pt assisted to toilet 

## 2020-02-05 NOTE — ED Notes (Signed)
Pt found walking in room, opening drawers and attempting to put own clothes on to leave room. Helped pt back into hospital gown. Pt was willing to sit in recliner. Offered tea, water, green beans from tray. Pt resting in recliner now, drinking water and tea, has eaten a few bites of food.

## 2020-02-05 NOTE — ED Notes (Signed)
Pt up out of room with IV removed and all monitoring equipment removed. Pt refusing to get back into bed at this time.

## 2020-02-05 NOTE — ED Notes (Signed)
Pt walking out into the halls stumbling and not willing to comply with the requests to sit down in her room. Pt jerking away at staff and irritated stating she wants to go home. Pt is unable to be re-directed. Agricultural consultant notified. Pt currently cussing at staff

## 2020-02-05 NOTE — ED Notes (Signed)
Pt resting in recliner with blanket on lap. Brought fresh ice water.

## 2020-02-05 NOTE — ED Notes (Signed)
Daughter Romie Minus- [phone]. States she would like MD to consider giving Seroquel for pt's "delerium." States that pt's mentation is "acute" and different from behavior at home e.g.- pt does not hallucinate at home per daughter.

## 2020-02-05 NOTE — ED Notes (Signed)
Pt still refusing to sit in bed or to have any monitoring equipment on at this time.

## 2020-02-05 NOTE — ED Notes (Signed)
Pt agreed to sit in recliner chair at this time with no equipment on. Pt continues to want to come out in to hallway and walk around.

## 2020-02-05 NOTE — ED Notes (Signed)
Pt encouraged to work on lunch tray and assisted with opening condiments/cutting food.

## 2020-02-05 NOTE — ED Notes (Signed)
IV wrapped with coban as protective measure.  

## 2020-02-05 NOTE — ED Notes (Signed)
Pt ate 15% of lunch tray, states it's good but she isn't hungry at this time. Will leave snack out for pt.

## 2020-02-05 NOTE — ED Notes (Addendum)
Pt ate about 40% of breakfast tray.

## 2020-02-05 NOTE — Progress Notes (Addendum)
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 intake.  Patient was treated with IV fluid resuscitation, tramadol and work-up for acute metabolic encephalopathy was initiated.  This morning patient seems to be what must be her baseline.  She is pleasant and talkative and notes she is looking forward to eating breakfast although she is not very hungry.  She states she does have pain in her mouth and does not understand why she has it.  Notes that it makes it difficult for her to chew but she is able to eat if she eats slowly.  Work-up is still pending including CT and UA however I suspect patient's agitation was likely secondary to pain and increased sundowning as she seems to be what must be her baseline this morning.  It is notable however but that patient seemed to be somewhat hemoconcentrated which may suggest some intravascular volume depletion.  Will await completion of acute metabolic encephalopathy work-up, DC IV fluids to ensure that patient is able to maintain oral hydration and consider discharge in the morning.  Repeat CBC in a.m.

## 2020-02-05 NOTE — H&P (Signed)
[place]   PATIENT NAME: Katie Logan    MR#:  [id number]  DATE OF BIRTH:  January 04, 1930  DATE OF ADMISSION:  02/04/2020  PRIMARY CARE PHYSICIAN: Kirk Ruths, MD   REQUESTING/REFERRING PHYSICIAN: Merlyn Lot, MD  CHIEF COMPLAINT:   Chief Complaint  Patient presents with  . Generalized Body Aches    HISTORY OF PRESENT ILLNESS:  Imagine Nest  is a 86 y.o. Caucasian female with a known history of type 2 diabetes mellitus, hypertension, dyslipidemia osteoarthritis and osteoporosis, who presented to the emergency room with acute onset of altered mental status with agitation and generalized body aches at her skilled nursing facility.  She also complained of oral pain.  She has a known history of squamous cell carcinoma of the mouth and has been getting radiotherapy for with most recent treatment yesterday.  She has been on tramadol and that was added Roxicodone for pain and since then she has been having worsening sundowning.  She has been having diminished oral intake of solid food but has been drinking fluids..  She was placed in the memory unit due to worsening agitation and confusion today.  No reported dysuria, oliguria or hematuria or flank pain.  No reported fever or chills.  No reported cough or dyspnea or wheezing.  No reported chest pain or palpitations.  The patient is a fairly poor historian due to her dementia.  Upon presentation to the emergency room, blood pressure was 154/93 with heart rate of 131 and otherwise normal vital signs. Labs revealed a blood glucose of 205 with a lactic acid of 2.7 and later 4.9 then 2.3. CBC was unremarkable except for relative neutrophilia with normal CBC. COVID-19 PCR is currently pending. Blood cultures were drawn. UA is currently pending was difficult to obtain as the patient apparently refused in and out catheterization for UA sample. Per the family she had an outpatient UA yesterday that was negative though.  The patient was  given Tylenol as well as 1 L bolus of IV lactated Ringer, 750 mL IV normal saline bolus 50 mg of tramadol and GI cocktail as well as IV Inapsine Neurontin. She will be admitted to an observation medical monitor bed for further evaluation and management. PAST MEDICAL HISTORY:   Past Medical History:  Diagnosis Date  . Diabetes mellitus ([place])    diet controlled  . Diabetes mellitus without complication ([place])   . Dry eye syndrome   . Hyperlipidemia, unspecified   . Hypertension   . Inflammatory arthritis   . Osteoporosis   . Postmenopausal     PAST SURGICAL HISTORY:   Past Surgical History:  Procedure Laterality Date  . ABDOMINAL HYSTERECTOMY    . APPENDECTOMY    . CAPSULOTOMY     OS  . CATARACT EXTRACTION W/PHACO Right 08/2008   [facility] 24.0D  . CATARACT EXTRACTION W/PHACO Left 01/18/2009   [facility] 24.5D    SOCIAL HISTORY:   Social History   Tobacco Use  . Smoking status: Former Smoker    Types: Cigarettes    Quit date: 05/26/1968    Years since quitting: 51.7  . Smokeless tobacco: Never Used  Substance Use Topics  . Alcohol use: No    FAMILY HISTORY:   Family History  Problem Relation Age of Onset  . Alzheimer's disease Mother   . Hypertension Father   . Alzheimer's disease Sister   . Alzheimer's disease Sister     DRUG ALLERGIES:   Allergies  Allergen Reactions  .  Epinephrine Other (See Comments)    Other Reaction: Passed out after dental inject  . Latex Other (See Comments)    Other Reaction: angioedema, mouth burns  . Levofloxacin     Other reaction(s): Hallucination  . Other Other (See Comments)    Uncoded Allergy. Allergen: ENVIRONMENTAL ALLERGIES    REVIEW OF SYSTEMS:   ROS As per history of present illness. All pertinent systems were reviewed above. Constitutional, HEENT, cardiovascular, respiratory, GI, GU, musculoskeletal, neuro, psychiatric, endocrine, integumentary and hematologic systems were reviewed and are otherwise  negative/unremarkable except for positive findings mentioned above in the HPI.   MEDICATIONS AT HOME:   Prior to Admission medications   Medication Sig Start Date End Date Taking? Authorizing Provider  acetaminophen (TYLENOL ARTHRITIS PAIN) 650 MG CR tablet Take 1,300 mg by mouth 2 (two) times daily.    Yes [provider]  atorvastatin (LIPITOR) 10 MG tablet Take 10 mg by mouth daily.   Yes [provider]  dexamethasone (DECADRON) 0.5 MG/5ML solution Take 0.5 mg by mouth See admin instructions. Rinse 71ml orally for 1 minute and spit out 4 times daily as needed   Yes [provider]  enalapril (VASOTEC) 20 MG tablet Take 20 mg by mouth 2 (two) times daily.    Yes [provider]  fluconazole (DIFLUCAN) 100 MG tablet Take 2 tablets (200 mg total) by mouth daily for 1 day, THEN 1 tablet (100 mg total) daily for 13 days. 01/27/20 02/10/20 Yes Verlon Au, NP  levothyroxine (SYNTHROID, LEVOTHROID) 50 MCG tablet Take 50 mcg by mouth daily before breakfast.    Yes [provider]  magnesium hydroxide (MILK OF MAGNESIA) 400 MG/5ML suspension Take 30 mLs by mouth daily as needed for moderate constipation.    Yes [provider]  metFORMIN (GLUCOPHAGE) 500 MG tablet Take 500 mg by mouth 2 (two) times daily with a meal.    Yes [provider]  oxyCODONE (OXY IR/ROXICODONE) 5 MG immediate release tablet Take 1 tablet (5 mg total) by mouth every 6 (six) hours as needed for severe pain (unrelieved by tylenol). 01/27/20  Yes Verlon Au, NP  potassium chloride (K-DUR,KLOR-CON) 10 MEQ tablet Take 20 mEq by mouth daily.    Yes [provider]  rivastigmine (EXELON) 9.5 mg/24hr Place 9.5 mg onto the skin daily. 0745   Yes [provider]  sucralfate (CARAFATE) 1 g tablet Take 1 tablet (1 g total) by mouth 3 (three) times daily. Dissolve in 3-4 tbsp warm water, swish and swallow. 12/20/19  Yes Chrystal, Eulas Post, MD  azithromycin  (ZITHROMAX) 250 MG tablet Take 250 mg by mouth as directed. Patient not taking: Reported on 02/04/2020 10/29/19   [provider]  guaiFENesin (ROBITUSSIN) 100 MG/5ML liquid Take 200 mg by mouth 3 (three) times daily as needed for cough.  Patient not taking: Reported on 02/04/2020    [provider]  magic mouthwash w/lidocaine SOLN Take 5 mLs by mouth 4 (four) times daily as needed for mouth pain. Swish and swallow 01/27/20   Verlon Au, NP      VITAL SIGNS:  Blood pressure (!) 161/86, pulse 94, temperature 98.8 F (37.1 C), temperature source Oral, resp. rate 18, height 5\' 3"  (1.6 m), weight 72.6 kg, SpO2 100 %.  PHYSICAL EXAMINATION:  Physical Exam  GENERAL:  86 y.o.-year-old Caucasian female patient lying in the bed with no acute distress. She was alert but uncooperative and globally confused. EYES: Pupils equal, round, reactive to  light and accommodation. No scleral icterus. Extraocular muscles intact.  HEENT: Head atraumatic, normocephalic. Oropharynx and nasopharynx clear.  NECK:  Supple, no jugular venous distention. No thyroid enlargement, no tenderness.  LUNGS: Normal breath sounds bilaterally, no wheezing, rales,rhonchi or crepitation. No use of accessory muscles of respiration.  CARDIOVASCULAR: Regular rate and rhythm, S1, S2 normal. No murmurs, rubs, or gallops.  ABDOMEN: Soft, nondistended, nontender. Bowel sounds present. No organomegaly or mass.  EXTREMITIES: No pedal edema, cyanosis, or clubbing.  NEUROLOGIC: Cranial nerves II through XII are intact. Muscle strength 5/5 in all extremities. Sensation intact. Gait not checked.  PSYCHIATRIC: The patient is alert and globally confused and uncooperative. She is not answering questions. SKIN: No obvious rash, lesion, or ulcer.   LABORATORY PANEL:   CBC Recent Labs  Lab 02/04/20 1510  WBC 9.6  HGB 13.0  HCT 40.3  PLT 342    ------------------------------------------------------------------------------------------------------------------  Chemistries  Recent Labs  Lab 02/04/20 1510  NA 142  K 4.1  CL 103  CO2 24  GLUCOSE 205*  BUN 26*  CREATININE 0.91  CALCIUM 10.1  AST 25  ALT 16  ALKPHOS 95  BILITOT 1.0   ------------------------------------------------------------------------------------------------------------------  Cardiac Enzymes No results for input(s): TROPONINI in the last 168 hours. ------------------------------------------------------------------------------------------------------------------  RADIOLOGY:  DG Chest Portable 1 View  Result Date: 02/04/2020 CLINICAL DATA:  Altered mental status. EXAM: PORTABLE CHEST 1 VIEW COMPARISON:  11/06/2016 FINDINGS: Heart size and pulmonary vascularity are normal. Diffuse interstitial pattern to the lungs is similar to the prior study, likely representing fibrosis. Mild bronchial wall thickening and lower lobe bronchiectasis. Probable emphysematous changes in the upper lungs. No developing consolidation or edema. No pleural effusions. No pneumothorax. Mediastinal contours appear intact. Calcification of the aorta. IMPRESSION: Chronic interstitial and emphysematous changes in the lungs. No acute consolidation or edema. Electronically Signed   By: Lucienne Capers M.D.   On: 02/04/2020 20:38      IMPRESSION AND PLAN:   1. Altered mental status/acute encephalopathy with agitation. The patient has underlying dementia could be presenting with behavioral disturbances. -The patient will be admitted to a observation medical monitor bed. -We will follow neurochecks every 4 hours for 24 hours. -We will obtain a stat head CT scan without contrast. -We will check a urinalysis with the possibility of UTI contributing to her symptoms. -We will continue her Exelon  2. Elevated lactic acid. All this could be related to UTI she does not have significant  leukocytosis and is afebrile. It could be related to dehydration. She has a history of oral squamous cell carcinoma status post radiotherapy that could have been contributing to diminished oral intake and anorexia with subsequent volume depletion.. -We will follow urinalysis plus/minus urine culture and blood cultures that were drawn. -Her Metformin will be held off.  3. Hypertensive urgency. Blood pressure has been as high as 186/79. -Will be placed on as needed IV labetalol. -This could be contributing to her encephalopathy with hypertensive element. -We will continue enalapril.  4. Hypothyroidism. -We will continue Synthroid and check TSH level.  5. Dyslipidemia. -We will continue statin therapy.  6. Type II diabetes mellitus. -We will place her on supplemental coverage with NovoLog and hold off Metformin especially given lactic acidosis.  7. DVT prophylaxis. -Subtenons Lovenox.   All the records are reviewed and case discussed with ED provider. The plan of care was discussed in details with the patient (and family). I answered all questions. The patient agreed to proceed with the above mentioned  plan. Further management will depend upon hospital course.   CODE STATUS: Full code  Status is: Observation  The patient remains OBS appropriate and will d/c before 2 midnights.  Dispo: The patient is from: ALF              Anticipated d/c is to: ALF              Anticipated d/c date is: 1 day              Patient currently is not medically stable to d/c.    TOTAL TIME TAKING CARE OF THIS PATIENT: 55 minutes.    Christel Mormon M.D on 02/05/2020 at 12:16 AM  [facility]   From 7 PM-7 AM, contact night-coverage [email or web address]  CC: Primary care physician; Kirk Ruths, MD   Note: This dictation was prepared with Dragon dictation along with smaller phrase technology. Any transcriptional typo errors that result from this process are unintentional.

## 2020-02-05 NOTE — ED Notes (Signed)
Pt out of room asking for bathroom. Pt shown to toilet in room.

## 2020-02-05 NOTE — ED Notes (Signed)
Pt continues to wander out of room without equipment on. Pt still refusing to wear cardiac monitoring or to stay in bed. Per Camera operator, [facility] stated that no sitters were available.

## 2020-02-06 ENCOUNTER — Inpatient Hospital Stay: Payer: Medicare Other | Admitting: Oncology

## 2020-02-06 ENCOUNTER — Telehealth: Payer: Self-pay

## 2020-02-06 DIAGNOSIS — G9341 Metabolic encephalopathy: Principal | ICD-10-CM

## 2020-02-06 LAB — CBC
HCT: 33.3 % — ABNORMAL LOW (ref 36.0–46.0)
Hemoglobin: 10.9 g/dL — ABNORMAL LOW (ref 12.0–15.0)
MCH: 29.9 pg (ref 26.0–34.0)
MCHC: 32.7 g/dL (ref 30.0–36.0)
MCV: 91.5 fL (ref 80.0–100.0)
Platelets: 211 10*3/uL (ref 150–400)
RBC: 3.64 MIL/uL — ABNORMAL LOW (ref 3.87–5.11)
RDW: 13.2 % (ref 11.5–15.5)
WBC: 6.2 10*3/uL (ref 4.0–10.5)
nRBC: 0 % (ref 0.0–0.2)

## 2020-02-06 MED ORDER — QUETIAPINE FUMARATE 25 MG PO TABS: 25.0000 mg | ORAL_TABLET | Freq: Every evening | ORAL | 0 refills | Status: DC | PRN

## 2020-02-06 NOTE — Discharge Summary (Signed)
Katie Logan NAT:[id number] DOB: 12/28/1929 DOA: 02/04/2020  PCP: Kirk Ruths, MD  Admit date: 02/04/2020  Discharge date: 02/06/2020  Admitted From: [facility] memory care unit disposition: [facility] unit   Recommendations for Outpatient Follow-up:   Follow up with PCP in 1-2 weeks  Home Health: N/A Equipment/Devices: N/A Consultations: None Discharge Condition: Stable CODE STATUS: Full Diet Recommendation: Heart Healthy   Diet Order            Diet - low sodium heart healthy           Diet Carb Modified Fluid consistency: Thin; Room service appropriate? Yes  Diet effective now                  Chief Complaint  Patient presents with  . Generalized Body Aches     Brief history of present illness from the day of admission and additional interim summary    Katie Logan  is a 86 y.o. Caucasian female with a known history of type 2 diabetes mellitus, hypertension, dyslipidemia osteoarthritis and osteoporosis, who presented to the emergency room with acute onset of altered mental status with agitation and generalized body aches at her skilled nursing facility.  She also complained of oral pain.  She has a known history of squamous cell carcinoma of the mouth and has been getting radiotherapy for with most recent treatment yesterday.  She has been on tramadol and that was added Roxicodone for pain and since then she has been having worsening sundowning.  She has been having diminished oral intake of solid food but has been drinking fluids..  She was placed in the memory unit due to worsening agitation and confusion today.  No reported dysuria, oliguria or hematuria or flank pain.  No reported fever or chills.  No reported cough or dyspnea or wheezing.  No reported chest pain or palpitations.   The patient is a fairly poor historian due to her dementia.  Upon presentation to the emergency room, blood pressure was 154/93 with heart rate of 131 and otherwise normal vital signs. Labs revealed a blood glucose of 205 with a lactic acid of 2.7 and later 4.9 then 2.3. CBC was unremarkable except for relative neutrophilia with normal CBC. COVID-19 PCR is currently pending. Blood cultures were drawn. UA is currently pending was difficult to obtain as the patient apparently refused in and out catheterization for UA sample. Per the family she had an outpatient UA yesterday that was negative though.                                                                   Hospital Course   Patient was admitted for delirium and underwent a full delirium work-up including head CT, routine laboratory evaluation, chest x-ray and head CT.  Work-up was negative with no evidence of any infection in chest abdomen or urine.  Head CT was also negative.  Laboratory data also did not reveal any electrolyte abnormalities which would explain her delirium.  Of note patient was noted to have essentially normal mental status in the morning where she was communicative and cooperative but started having worsening mental status changes over the course of the evening.  Of note patient was found to be hallucinating after taking her oxycodone and seemed to be more agitated.  Patient's delirium was attributed to oxycodone and this was discontinued.  The night before discharge patient was quite agitated and required Haldol 2.5 mg which resulted in patient becoming quite sleepy.  Patient is discharged home on as needed Seroquel in case agitation continues until the oxycodone is entirely out of her system.  Patient is to follow-up with her PCP for management of sundowning and potential delirium which can last for a while even after discharge.  She is also to follow-up with Dr. Grayland Ormond of oncology for further pain management as  warranted.  All of this was discussed at length with patient's daughter who was here visiting from [place].   Discharge diagnosis     Active Problems:   AMS (altered mental status)   Acute metabolic encephalopathy    Discharge instructions    Discharge Instructions    Diet - low sodium heart healthy   Complete by: As directed    Discharge instructions   Complete by: As directed    1. It is very possible that it is oxycodone that was increasing confusion 2. Stop oxycodone 3. Consider Seroquel at bedtime if very agitated. I have prescribed 5 pills PRN at bedtime. Please discuss with PCP before administering.   Increase activity slowly   Complete by: As directed       Discharge Medications   Allergies as of 02/06/2020      Reactions   Epinephrine Other (See Comments)   Other Reaction: Passed out after dental inject   Latex Other (See Comments)   Other Reaction: angioedema, mouth burns   Levofloxacin    Other reaction(s): Hallucination   Other Other (See Comments)   Uncoded Allergy. Allergen: ENVIRONMENTAL ALLERGIES      Medication List    STOP taking these medications   oxyCODONE 5 MG immediate release tablet Commonly known as: Oxy IR/ROXICODONE     TAKE these medications   atorvastatin 10 MG tablet Commonly known as: LIPITOR Take 10 mg by mouth daily.   azithromycin 250 MG tablet Commonly known as: ZITHROMAX Take 250 mg by mouth as directed.   dexamethasone 0.5 MG/5ML solution Commonly known as: DECADRON Take 0.5 mg by mouth See admin instructions. Rinse 43ml orally for 1 minute and spit out 4 times daily as needed   enalapril 20 MG tablet Commonly known as: VASOTEC Take 20 mg by mouth 2 (two) times daily.   fluconazole 100 MG tablet Commonly known as: DIFLUCAN Take 2 tablets (200 mg total) by mouth daily for 1 day, THEN 1 tablet (100 mg total) daily for 13 days. Start taking on: January 27, 2020   guaiFENesin 100 MG/5ML liquid Commonly known  as: ROBITUSSIN Take 200 mg by mouth 3 (three) times daily as needed for cough.   levothyroxine 50 MCG tablet Commonly known as: SYNTHROID Take 50 mcg by mouth daily before breakfast.   magic mouthwash w/lidocaine Soln Take 5 mLs by mouth 4 (four) times daily as needed for mouth pain. Swish and  swallow   magnesium hydroxide 400 MG/5ML suspension Commonly known as: MILK OF MAGNESIA Take 30 mLs by mouth daily as needed for moderate constipation.   metFORMIN 500 MG tablet Commonly known as: GLUCOPHAGE Take 500 mg by mouth 2 (two) times daily with a meal.   potassium chloride 10 MEQ tablet Commonly known as: KLOR-CON Take 20 mEq by mouth daily.   QUEtiapine 25 MG tablet Commonly known as: SEROquel Take 1 tablet (25 mg total) by mouth at bedtime as needed for up to 5 days (severe agitation).   rivastigmine 9.5 mg/24hr Commonly known as: EXELON Place 9.5 mg onto the skin daily. 0745   sucralfate 1 g tablet Commonly known as: Carafate Take 1 tablet (1 g total) by mouth 3 (three) times daily. Dissolve in 3-4 tbsp warm water, swish and swallow.   Tylenol Arthritis Pain 650 MG CR tablet Generic drug: acetaminophen Take 1,300 mg by mouth 2 (two) times daily.         Major procedures and Radiology Reports - PLEASE review detailed and final reports thoroughly  -       CT HEAD WO CONTRAST  Result Date: 02/05/2020 CLINICAL DATA:  Delirium.  History of mouth carcinoma EXAM: CT HEAD WITHOUT CONTRAST TECHNIQUE: Contiguous axial images were obtained from the base of the skull through the vertex without intravenous contrast. COMPARISON:  June 30, 2015 FINDINGS: Brain: Age related volume loss present. There is no intracranial mass, hemorrhage, extra-axial fluid collection, or midline shift. There is mild patchy small vessel disease in the centra semiovale bilaterally. There is evidence of a prior lacunar type infarct in the inferior aspect of the anterior left centrum semiovale. No  acute infarct is demonstrable. Vascular: No hyperdense vessel. There is calcification in each carotid siphon region. Skull: Bony calvarium appears intact. There is frontal hyperostosis bilaterally. Sinuses/Orbits: There is mucosal thickening in several ethmoid air cells. There is a minimal air-fluid level in the left sphenoid sinus. Other visualized paranasal sinuses are clear. Orbits appear symmetric bilaterally. Other: Mastoid air cells are clear. IMPRESSION: Age related volume loss with mild patchy periventricular small vessel disease. 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 No evident acute infarct. No mass or hemorrhage. There are foci of arterial vascular calcification. There are foci of paranasal sinus disease. Electronically Signed   By: Lowella Grip III M.D.   On: 02/05/2020 15:14   DG Chest Portable 1 View  Result Date: 02/04/2020 CLINICAL DATA:  Altered mental status. EXAM: PORTABLE CHEST 1 VIEW COMPARISON:  11/06/2016 FINDINGS: Heart size and pulmonary vascularity are normal. Diffuse interstitial pattern to the lungs is similar to the prior study, likely representing fibrosis. Mild bronchial wall thickening and lower lobe bronchiectasis. Probable emphysematous changes in the upper lungs. No developing consolidation or edema. No pleural effusions. No pneumothorax. Mediastinal contours appear intact. Calcification of the aorta. IMPRESSION: Chronic interstitial and emphysematous changes in the lungs. No acute consolidation or edema. Electronically Signed   By: Lucienne Capers M.D.   On: 02/04/2020 20:38    Micro Results    Recent Results (from the past 240 hour(s))  Blood culture (routine x 2)     Status: None (Preliminary result)   Collection Time: 02/05/20 12:48 AM   Specimen: BLOOD  Result Value Ref Range Status   Specimen Description BLOOD BLOOD RIGHT HAND  Final   Special Requests   Final    BOTTLES DRAWN AEROBIC AND ANAEROBIC Blood Culture adequate  volume   Culture  Final    NO GROWTH 1 DAY Performed at [facility], [address] [place], [place] [address]    Report Status PENDING  Incomplete  Blood culture (routine x 2)     Status: None (Preliminary result)   Collection Time: 02/05/20 12:48 AM   Specimen: BLOOD  Result Value Ref Range Status   Specimen Description BLOOD RIGHT ANTECUBITAL  Final   Special Requests   Final    BOTTLES DRAWN AEROBIC AND ANAEROBIC Blood Culture results may not be optimal due to an inadequate volume of blood received in culture bottles   Culture   Final    NO GROWTH 1 DAY Performed at [facility], [address], [place], [place] [address]    Report Status PENDING  Incomplete  SARS Coronavirus 2 by RT PCR (hospital order, performed in [place] hospital lab) Nasopharyngeal Nasopharyngeal Swab     Status: None   Collection Time: 02/05/20 12:48 AM   Specimen: Nasopharyngeal Swab  Result Value Ref Range Status   SARS Coronavirus 2 NEGATIVE NEGATIVE Final    Comment: (NOTE) SARS-CoV-2 target nucleic acids are NOT DETECTED.  The SARS-CoV-2 RNA is generally detectable in upper and lower respiratory specimens during the acute phase of infection. The lowest concentration of SARS-CoV-2 viral copies this assay can detect is 250 copies / mL. A negative result does not preclude SARS-CoV-2 infection and should not be used as the sole basis for treatment or other patient management decisions.  A negative result may occur with improper specimen collection / handling, submission of specimen other than nasopharyngeal swab, presence of viral mutation(s) within the areas targeted by this assay, and inadequate number of viral copies (<250 copies / mL). A negative result must be combined with clinical observations, patient history, and epidemiological information.  Fact Sheet for Patients:   StrictlyIdeas.no  Fact Sheet for Healthcare  Providers: BankingDealers.co.za  This test is not yet approved or  cleared by the [place] FDA and has been authorized for detection and/or diagnosis of SARS-CoV-2 by FDA under an Emergency Use Authorization (EUA).  This EUA will remain in effect (meaning this test can be used) for the duration of the COVID-19 declaration under Section 564(b)(1) of the Act, 21 U.S.C. section 360bbb-3(b)(1), unless the authorization is terminated or revoked sooner.  Performed at [facility], [address] [place], [place] [address]     Today   Subjective    Katie Logan is quite sleepy.  She was awake she said she felt fine although she was surprised that she continued to have pain in her mouth, was not sure why she had so much pain.  Objective   Blood pressure (!) 112/55, pulse 72, temperature 97.6 F (36.4 C), temperature source Oral, resp. rate 16, height 5\' 3"  (1.6 m), weight 72.6 kg, SpO2 99 %.  No intake or output data in the 24 hours ending 02/06/20 1217  Exam General:  Sleepy patient lying in bed in no acute distress, arousable to voice alone.   Eyes: sclera anicteric, conjuctiva mild injection bilaterally CVS: S1-S2, regular  Respiratory:  decreased air entry bilaterally secondary to decreased inspiratory effort, rales at bases  GI: NABS, soft, NT  LE: No edema.  Neuro:  grossly nonfocal with severe memory impairment.    Data Review   CBC w Diff:  Lab Results  Component Value Date   WBC 6.2 02/06/2020   HGB 10.9 (L) 02/06/2020   HCT 33.3 (L) 02/06/2020   PLT  211 02/06/2020   LYMPHOPCT 8 02/04/2020   MONOPCT 7 02/04/2020   EOSPCT 0 02/04/2020   BASOPCT 0 02/04/2020    CMP:  Lab Results  Component Value Date   NA 137 02/05/2020   K 4.2 02/05/2020   CL 103 02/05/2020   CO2 24 02/05/2020   BUN 20 02/05/2020   CREATININE 0.71 02/05/2020   PROT 7.8 02/04/2020   ALBUMIN 5.0 02/04/2020   BILITOT 1.0 02/04/2020   ALKPHOS 95  02/04/2020   AST 25 02/04/2020   ALT 16 02/04/2020  .   Total Time in preparing paper work, data evaluation and todays exam - 35 minutes  Vashti Hey M.D on 02/06/2020 at 12:17 PM  [facility]   Office  [phone]

## 2020-02-06 NOTE — ED Notes (Signed)
Pt currently sleeping- will monitor cbg after pt wakes up

## 2020-02-06 NOTE — ED Notes (Signed)
Pt currently sleeping. Will notify attending when pt wakes up.

## 2020-02-06 NOTE — Telephone Encounter (Signed)
Patient's daughter called this morning and stated patient is currently in ED and has been admitted. She had episode of delirium, hallucinations, and pain.  Daughter wanted to know if she should bring patient in today for scheduled appointment at 1.   Consulted with provider. Provider advised patient to follow up with PCP once patient is discharged. Stop roxicodone. Restart prior tramadol. Follow up with him 1 week with labs.   Informed daughter of provider's recommendations. She verbalized understanding and requested order written sent to [facility].  Orders faxed to [facility] to dc roxicodone and restart tramadol.

## 2020-02-07 ENCOUNTER — Telehealth (HOSPITAL_COMMUNITY): Payer: Self-pay | Admitting: Nurse Practitioner

## 2020-02-07 DIAGNOSIS — C069 Malignant neoplasm of mouth, unspecified: Secondary | ICD-10-CM

## 2020-02-07 NOTE — Telephone Encounter (Signed)
Spoke to NP Aaron Edelman at [facility]. Patient dicharged from hospital yesterday. No reports of increased pain overnight. Hasn't yet restarted tramadol. Fissure in corner of mouth improving with diflucan and overall he feels pain improving. Patient completed radiation on 9/10. He'll call clinic if symptoms don't continue to improve or worsen.

## 2020-02-10 LAB — CULTURE, BLOOD (ROUTINE X 2)
Culture: NO GROWTH
Culture: NO GROWTH
Special Requests: ADEQUATE

## 2020-02-16 ENCOUNTER — Inpatient Hospital Stay: Payer: Medicare Other | Admitting: Oncology

## 2020-02-24 ENCOUNTER — Inpatient Hospital Stay: Payer: Medicare Other | Attending: Oncology | Admitting: Oncology

## 2020-02-24 ENCOUNTER — Other Ambulatory Visit: Payer: Self-pay

## 2020-02-24 ENCOUNTER — Encounter: Payer: Self-pay | Admitting: Oncology

## 2020-02-24 VITALS — BP 125/64 | HR 106 | Temp 97.8°F | Resp 20 | Wt 116.4 lb

## 2020-02-24 DIAGNOSIS — R1310 Dysphagia, unspecified: Secondary | ICD-10-CM | POA: Diagnosis not present

## 2020-02-24 DIAGNOSIS — D649 Anemia, unspecified: Secondary | ICD-10-CM | POA: Insufficient documentation

## 2020-02-24 DIAGNOSIS — C069 Malignant neoplasm of mouth, unspecified: Secondary | ICD-10-CM | POA: Diagnosis present

## 2020-02-24 DIAGNOSIS — R634 Abnormal weight loss: Secondary | ICD-10-CM | POA: Insufficient documentation

## 2020-02-24 DIAGNOSIS — Z923 Personal history of irradiation: Secondary | ICD-10-CM | POA: Insufficient documentation

## 2020-02-24 DIAGNOSIS — Z87891 Personal history of nicotine dependence: Secondary | ICD-10-CM | POA: Diagnosis not present

## 2020-02-24 NOTE — Progress Notes (Signed)
Patient here today with daughter. Denies patient is in any pain. Reports decreased appetite is her main concern. Reports patient has lost significant amount of weight. Daughter states she is currently being seen by speech pathologist concerning patient's diet.

## 2020-02-24 NOTE — Progress Notes (Signed)
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  INTERVAL HISTORY: Patient returns to clinic today for further evaluation.  She completed XRT approximately 3 weeks ago.  She still complains of mild dysphagia and poor appetite, but is actively being evaluated by speech pathology.  Her daughter reports weight loss.  She continues to have issues with memory and much of the history is given by her daughter.  She does not complain of pain today.  She has no neurologic complaints.  She denies any recent fevers or illnesses. She has no chest pain, shortness of breath, cough, or hemoptysis.  She denies any nausea, vomiting, constipation, or diarrhea.  She has no urinary complaints.  Patient offers no further specific complaints today.  REVIEW OF SYSTEMS:   Review of Systems  Constitutional: Positive for weight loss. Negative for fever and malaise/fatigue.  Respiratory: Negative.  Negative for cough, hemoptysis and shortness of breath.   Cardiovascular: Negative.  Negative for chest pain and leg swelling.  Gastrointestinal: Negative.  Negative for abdominal pain, blood in stool and melena.  Genitourinary: Negative.  Negative for hematuria.  Musculoskeletal: Negative.   Skin: Negative.  Negative for rash.  Neurological: Negative.  Negative for dizziness, focal weakness, weakness and headaches.  Psychiatric/Behavioral: Positive for memory loss. The patient is not nervous/anxious.     As per HPI. Otherwise, a complete review of systems is  negative.  PAST MEDICAL HISTORY: Past Medical History:  Diagnosis Date  . Diabetes mellitus ([place])    diet controlled  . Diabetes mellitus without complication ([place])   . Dry eye syndrome   . Hyperlipidemia, unspecified   . Hypertension   . Inflammatory arthritis   . Osteoporosis   . Postmenopausal     PAST SURGICAL HISTORY: Past Surgical History:  Procedure Laterality Date  . ABDOMINAL HYSTERECTOMY    . APPENDECTOMY    . CAPSULOTOMY     OS  . CATARACT EXTRACTION W/PHACO Right 08/2008   crystalens 24.0D  . CATARACT EXTRACTION W/PHACO Left 01/18/2009   Crystalens 24.5D    FAMILY HISTORY: Family History  Problem Relation Age of Onset  . Alzheimer's disease Mother   . Hypertension Father   . Alzheimer's disease Sister   . Alzheimer's disease Sister     ADVANCED DIRECTIVES (Y/N):  N  HEALTH MAINTENANCE: Social History   Tobacco Use  . Smoking status: Former Smoker    Types: Cigarettes    Quit date: 05/26/1968    Years since quitting: 51.7  . Smokeless tobacco: Never Used  Vaping Use  . Vaping Use: Never used  Substance Use Topics  . Alcohol use: No  . Drug use: No     Colonoscopy:  PAP:  Bone density:  Lipid panel:  Allergies  Allergen Reactions  . Epinephrine Other (See Comments)    Other Reaction: Passed out after dental inject  . Latex Other (See Comments)    Other Reaction: angioedema, mouth burns  . Levofloxacin     Other reaction(s): Hallucination  . Other Other (See Comments)    Uncoded Allergy. Allergen: ENVIRONMENTAL ALLERGIES    Current Outpatient Medications  Medication Sig Dispense Refill  . acetaminophen (TYLENOL ARTHRITIS PAIN) 650 MG CR tablet Take 1,300 mg by mouth 2 (two) times daily.     Marland Kitchen atorvastatin (LIPITOR) 10 MG tablet Take 10 mg by mouth daily.    Marland Kitchen dexamethasone (DECADRON) 0.5 MG/5ML solution Take 0.5 mg by mouth See admin instructions. Rinse 48ml orally for 1 minute and spit out 4 times daily as needed    . enalapril  (VASOTEC) 20 MG tablet Take 20 mg by mouth 2 (two) times daily.     Marland Kitchen guaiFENesin (ROBITUSSIN) 100 MG/5ML liquid Take 200 mg by mouth 3 (three) times daily as needed for cough.     . levothyroxine (SYNTHROID, LEVOTHROID) 50 MCG tablet Take 50 mcg by mouth daily before breakfast.     . magic mouthwash w/lidocaine SOLN Take 5 mLs by mouth 4 (four) times daily as needed for mouth pain. Swish and swallow 280 mL 0  . magnesium hydroxide (MILK OF MAGNESIA) 400 MG/5ML suspension Take 30 mLs by mouth daily as needed for moderate constipation.     . metFORMIN (GLUCOPHAGE) 500 MG tablet Take 500 mg by mouth 2 (two) times daily with a meal.     . potassium chloride (K-DUR,KLOR-CON) 10 MEQ tablet Take 20 mEq by mouth daily.     . QUEtiapine (SEROQUEL) 25 MG tablet Take 1 tablet (25 mg total) by mouth at bedtime as needed for up to 5 days (severe agitation). 5 tablet 0  . rivastigmine (EXELON) 9.5 mg/24hr Place 9.5 mg onto the skin daily. 0745    . sucralfate (CARAFATE) 1 g tablet Take 1 tablet (1 g total) by mouth 3 (three) times daily. Dissolve in 3-4 tbsp warm water, swish and swallow. 90 tablet 1  . traMADol (ULTRAM) 50 MG tablet Take 50 mg by mouth every 4 (four) hours as needed.    Marland Kitchen azithromycin (ZITHROMAX) 250 MG tablet Take 250 mg by mouth as directed.  (Patient not taking: Reported on 02/24/2020)     No current facility-administered medications for this visit.    OBJECTIVE: Vitals:   02/24/20 1036  BP: 125/64  Pulse: (!) 106  Resp: 20  Temp: 97.8 F (36.6 C)  SpO2: 98%     Body mass index is 20.62 kg/m.    ECOG FS:1 - Symptomatic but completely ambulatory  General: Well-developed, well-nourished, no acute distress. Eyes: Pink conjunctiva, anicteric sclera. HEENT: Normocephalic, moist mucous membranes.  No lesions noted.  No lymphadenopathy. Lungs: No audible wheezing or coughing. Heart: Regular rate and rhythm. Abdomen: Soft, nontender, no obvious distention. Musculoskeletal: No edema,  cyanosis, or clubbing. Neuro: Alert, confused. Cranial nerves grossly intact. Skin: No rashes or petechiae noted. Psych: Normal affect.   LAB RESULTS:  Lab Results  Component Value Date   NA 137 02/05/2020   K 4.2 02/05/2020   CL 103 02/05/2020   CO2 24 02/05/2020   GLUCOSE 123 (H) 02/05/2020   BUN 20 02/05/2020   CREATININE 0.71 02/05/2020   CALCIUM 9.1 02/05/2020   PROT 7.8 02/04/2020   ALBUMIN 5.0 02/04/2020   AST 25 02/04/2020   ALT 16 02/04/2020   ALKPHOS 95 02/04/2020   BILITOT 1.0 02/04/2020   GFRNONAA >60 02/05/2020   GFRAA >60 02/05/2020    Lab Results  Component Value Date   WBC 6.2 02/06/2020   NEUTROABS 8.1 (H) 02/04/2020   HGB 10.9 (L) 02/06/2020   HCT 33.3 (L) 02/06/2020   MCV 91.5 02/06/2020   PLT 211 02/06/2020  STUDIES: CT HEAD WO CONTRAST  Result Date: 02/05/2020 CLINICAL DATA:  Delirium.  History of mouth carcinoma EXAM: CT HEAD WITHOUT CONTRAST TECHNIQUE: Contiguous axial images were obtained from the base of the skull through the vertex without intravenous contrast. COMPARISON:  June 30, 2015 FINDINGS: Brain: Age related volume loss present. There is no intracranial mass, hemorrhage, extra-axial fluid collection, or midline shift. There is mild patchy small vessel disease in the centra semiovale bilaterally. There is evidence of a prior lacunar type infarct in the inferior aspect of the anterior left centrum semiovale. No acute infarct is demonstrable. Vascular: No hyperdense vessel. There is calcification in each carotid siphon region. Skull: Bony calvarium appears intact. There is frontal hyperostosis bilaterally. Sinuses/Orbits: There is mucosal thickening in several ethmoid air cells. There is a minimal air-fluid level in the left sphenoid sinus. Other visualized paranasal sinuses are clear. Orbits appear symmetric bilaterally. Other: Mastoid air cells are clear. IMPRESSION: Age related volume loss with mild patchy periventricular small vessel  disease. 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 No evident acute infarct. No mass or hemorrhage. There are foci of arterial vascular calcification. There are foci of paranasal sinus disease. Electronically Signed   By: Lowella Grip III M.D.   On: 02/05/2020 15:14   DG Chest Portable 1 View  Result Date: 02/04/2020 CLINICAL DATA:  Altered mental status. EXAM: PORTABLE CHEST 1 VIEW COMPARISON:  11/06/2016 FINDINGS: Heart size and pulmonary vascularity are normal. Diffuse interstitial pattern to the lungs is similar to the prior study, likely representing fibrosis. Mild bronchial wall thickening and lower lobe bronchiectasis. Probable emphysematous changes in the upper lungs. No developing consolidation or edema. No pleural effusions. No pneumothorax. Mediastinal contours appear intact. Calcification of the aorta. IMPRESSION: Chronic interstitial and emphysematous changes in the lungs. No acute consolidation or edema. Electronically Signed   By: Lucienne Capers M.D.   On: 02/04/2020 20:38    ASSESSMENT: Squamous cell carcinoma of oral cavity.  PLAN:    1. Squamous cell carcinoma of oral cavity: PET scan results from November 22, 2027 reviewed independently with no obvious metastatic disease.  Given her advanced age and decreased memory, patient received XRT only.  This was completed approximately 3 weeks ago.  No intervention is needed at this time.  Patient will return to clinic in 2 months with repeat PET scan and further evaluation.  We will also refer back to ENT at that time. 2.  Dysphagia/weight loss: Continue follow-up with speech pathology. 3.  Anemia: Mild, monitor.    Patient expressed understanding and was in agreement with this plan. She also understands that She can call clinic at any time with any questions, concerns, or complaints.   Cancer Staging No matching staging information was found for the patient.  Lloyd Huger, MD   02/24/2020 12:00  PM

## 2020-03-05 ENCOUNTER — Other Ambulatory Visit: Payer: Self-pay

## 2020-03-05 ENCOUNTER — Ambulatory Visit
Admission: RE | Admit: 2020-03-05 | Discharge: 2020-03-05 | Disposition: A | Discharge status: Home / Self Care | Payer: Medicare Other | Source: Ambulatory Visit | Attending: Radiation Oncology | Admitting: Radiation Oncology

## 2020-03-05 VITALS — BP 135/79 | HR 100 | Temp 97.4°F | Resp 16 | Wt 114.5 lb

## 2020-03-05 DIAGNOSIS — C069 Malignant neoplasm of mouth, unspecified: Secondary | ICD-10-CM

## 2020-03-05 DIAGNOSIS — C44329 Squamous cell carcinoma of skin of other parts of face: Secondary | ICD-10-CM | POA: Diagnosis present

## 2020-03-05 DIAGNOSIS — R634 Abnormal weight loss: Secondary | ICD-10-CM | POA: Diagnosis not present

## 2020-03-05 DIAGNOSIS — R630 Anorexia: Secondary | ICD-10-CM | POA: Insufficient documentation

## 2020-03-05 DIAGNOSIS — Z923 Personal history of irradiation: Secondary | ICD-10-CM | POA: Insufficient documentation

## 2020-03-05 NOTE — Progress Notes (Signed)
Radiation Oncology Follow up Note  Name: Katie Logan   Date:   03/05/2020 MRN:  [id number] DOB: [birth year]    This 86 y.o. 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  REFERRING PROVIDER: Kirk Ruths, MD  HPI: Patient is an 86 year old female with dementia now at 1 month having completed external beam radiation therapy for squamous cell carcinoma the right buccal mucosa.  Seen today in follow-up she is doing fairly well states the pain is somewhat improved.  Not sure how much of the Carafate rinses that are giving her at the nursing facility but have asked her to make sure that is being done.  She continues to have some mild weight loss and anorexia.Marland Kitchen  She had a CT scan of the head back in early September showing age-related volume loss and periventricular small vessel disease she also had a small infarct in the anterior left centrum.  COMPLICATIONS OF TREATMENT: none  FOLLOW UP COMPLIANCE: keeps appointments   PHYSICAL EXAM:  BP 135/79 (BP Location: Right Arm, Patient Position: Sitting, Cuff Size: Small)   Pulse 100   Temp (!) 97.4 F (36.3 C)   Resp 16   Wt 114 lb 8 oz (51.9 kg)   BMI 20.28 kg/m  Area of the right buccal mucosa shows excellent response to therapy.  There is still some mild ulceration present.  No evidence of adenopathy in the cervical or supraclavicular region.  Well-developed well-nourished patient in NAD. HEENT reveals PERLA, EOMI, discs not visualized.  Oral cavity is clear. No oral mucosal lesions are identified. Neck is clear without evidence of cervical or supraclavicular adenopathy. Lungs are clear to A&P. Cardiac examination is essentially unremarkable with regular rate and rhythm without murmur rub or thrill. Abdomen is benign with no organomegaly or masses noted. Motor sensory and DTR levels are equal and symmetric in the upper and  lower extremities. Cranial nerves II through XII are grossly intact. Proprioception is intact. No peripheral adenopathy or edema is identified. No motor or sensory levels are noted. Crude visual fields are within normal range.  RADIOLOGY RESULTS: CT scan of the head reviewed  PLAN: Present time patient is doing fairly well.  I have asked her daughter to make sure they are doing the Carafate rinses at least 3 times a day.  Otherwise pleased over with her overall progress she has a PET scan scheduled in December.  I have asked to see her back in 3 to 4 months for follow-up.  I have also asked him to reestablish care with Dr. Tami Ribas.  I would like to take this opportunity to thank you for allowing me to participate in the care of your patient.Noreene Filbert, MD

## 2020-03-27 ENCOUNTER — Other Ambulatory Visit
Admission: RE | Admit: 2020-03-27 | Discharge: 2020-03-27 | Disposition: A | Discharge status: Home / Self Care | Payer: Medicare Other | Source: Skilled Nursing Facility | Attending: Internal Medicine | Admitting: Internal Medicine

## 2020-03-27 DIAGNOSIS — R5381 Other malaise: Secondary | ICD-10-CM | POA: Insufficient documentation

## 2020-03-27 DIAGNOSIS — R509 Fever, unspecified: Secondary | ICD-10-CM | POA: Insufficient documentation

## 2020-03-27 LAB — CBC
HCT: 34.7 % — ABNORMAL LOW (ref 36.0–46.0)
Hemoglobin: 11.4 g/dL — ABNORMAL LOW (ref 12.0–15.0)
MCH: 30.5 pg (ref 26.0–34.0)
MCHC: 32.9 g/dL (ref 30.0–36.0)
MCV: 92.8 fL (ref 80.0–100.0)
Platelets: 266 10*3/uL (ref 150–400)
RBC: 3.74 MIL/uL — ABNORMAL LOW (ref 3.87–5.11)
RDW: 13.3 % (ref 11.5–15.5)
WBC: 6.8 10*3/uL (ref 4.0–10.5)
nRBC: 0 % (ref 0.0–0.2)

## 2020-03-27 LAB — COMPREHENSIVE METABOLIC PANEL
ALT: 12 U/L (ref 0–44)
AST: 17 U/L (ref 15–41)
Albumin: 3.8 g/dL (ref 3.5–5.0)
Alkaline Phosphatase: 63 U/L (ref 38–126)
Anion gap: 9 (ref 5–15)
BUN: 18 mg/dL (ref 8–23)
CO2: 27 mmol/L (ref 22–32)
Calcium: 9.6 mg/dL (ref 8.9–10.3)
Chloride: 102 mmol/L (ref 98–111)
Creatinine, Ser: 0.6 mg/dL (ref 0.44–1.00)
GFR, Estimated: >60 mL/min (ref >60)
Glucose, Bld: 107 mg/dL — ABNORMAL HIGH (ref 70–99)
Potassium: 4.2 mmol/L (ref 3.5–5.1)
Sodium: 138 mmol/L (ref 135–145)
Total Bilirubin: 0.7 mg/dL (ref 0.3–1.2)
Total Protein: 6.6 g/dL (ref 6.5–8.1)

## 2020-03-27 LAB — URINALYSIS, COMPLETE (UACMP) WITH MICROSCOPIC
Bacteria, UA: NONE SEEN
Bilirubin Urine: NEGATIVE
Glucose, UA: NEGATIVE mg/dL
Hgb urine dipstick: NEGATIVE
Ketones, ur: NEGATIVE mg/dL
Leukocytes,Ua: NEGATIVE
Nitrite: NEGATIVE
Protein, ur: NEGATIVE mg/dL
Specific Gravity, Urine: 1.021 (ref 1.005–1.030)
pH: 6 (ref 5.0–8.0)

## 2020-03-28 LAB — URINE CULTURE: Culture: <10000 — AB

## 2020-04-05 ENCOUNTER — Encounter
Admission: RE | Admit: 2020-04-05 | Discharge: 2020-04-05 | Disposition: A | Discharge status: Home / Self Care | Payer: Medicare Other | Source: Ambulatory Visit | Attending: Internal Medicine | Admitting: Internal Medicine

## 2020-04-13 ENCOUNTER — Inpatient Hospital Stay (HOSPITAL_BASED_OUTPATIENT_CLINIC_OR_DEPARTMENT_OTHER): Payer: Medicare Other | Admitting: Hospice and Palliative Medicine

## 2020-04-13 ENCOUNTER — Inpatient Hospital Stay: Payer: Medicare Other | Attending: Oncology | Admitting: Oncology

## 2020-04-13 ENCOUNTER — Encounter: Payer: Self-pay | Admitting: Oncology

## 2020-04-13 ENCOUNTER — Other Ambulatory Visit: Payer: Self-pay

## 2020-04-13 VITALS — BP 113/73 | HR 126 | Temp 98.2°F | Wt 114.0 lb

## 2020-04-13 DIAGNOSIS — R634 Abnormal weight loss: Secondary | ICD-10-CM | POA: Diagnosis not present

## 2020-04-13 DIAGNOSIS — C039 Malignant neoplasm of gum, unspecified: Secondary | ICD-10-CM | POA: Diagnosis present

## 2020-04-13 DIAGNOSIS — Z9049 Acquired absence of other specified parts of digestive tract: Secondary | ICD-10-CM | POA: Diagnosis not present

## 2020-04-13 DIAGNOSIS — C069 Malignant neoplasm of mouth, unspecified: Secondary | ICD-10-CM

## 2020-04-13 DIAGNOSIS — Z881 Allergy status to other antibiotic agents status: Secondary | ICD-10-CM | POA: Diagnosis not present

## 2020-04-13 DIAGNOSIS — G893 Neoplasm related pain (acute) (chronic): Secondary | ICD-10-CM

## 2020-04-13 DIAGNOSIS — Z515 Encounter for palliative care: Secondary | ICD-10-CM

## 2020-04-13 DIAGNOSIS — F0390 Unspecified dementia without behavioral disturbance: Secondary | ICD-10-CM | POA: Insufficient documentation

## 2020-04-13 DIAGNOSIS — Z87891 Personal history of nicotine dependence: Secondary | ICD-10-CM | POA: Insufficient documentation

## 2020-04-13 DIAGNOSIS — D649 Anemia, unspecified: Secondary | ICD-10-CM | POA: Diagnosis not present

## 2020-04-13 DIAGNOSIS — R1310 Dysphagia, unspecified: Secondary | ICD-10-CM | POA: Insufficient documentation

## 2020-04-13 DIAGNOSIS — Z8249 Family history of ischemic heart disease and other diseases of the circulatory system: Secondary | ICD-10-CM | POA: Diagnosis not present

## 2020-04-13 DIAGNOSIS — Z818 Family history of other mental and behavioral disorders: Secondary | ICD-10-CM | POA: Insufficient documentation

## 2020-04-13 DIAGNOSIS — Z79899 Other long term (current) drug therapy: Secondary | ICD-10-CM | POA: Diagnosis not present

## 2020-04-13 MED ORDER — TRAMADOL HCL 50 MG PO TABS
100.0000 mg | ORAL_TABLET | Freq: Three times a day (TID) | ORAL | 0 refills | Status: AC
Start: 2020-04-13 — End: ?

## 2020-04-13 NOTE — Progress Notes (Signed)
[facility]  Telephone:([phone] [phone]   Name: Katie Logan Date: 04/13/2020 MRN: [id number]  DOB: [birth year]  Patient Care Team: Kirk Ruths, MD as PCP - General (Internal Medicine) Kirk Ruths, MD (Internal Medicine) Nyoka Cowden Phylis Bougie, NP as Nurse Practitioner (Geriatric Medicine) Lloyd Huger, MD as Consulting Physician (Oncology) Noreene Filbert, MD as Radiation Oncologist (Radiation Oncology)    REASON FOR CONSULTATION: Katie Logan is a 86 y.o. 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  Patient has had progressive weight loss with poor oral intake.  She is continued to have severe pain.  Symptoms are complicated by her dementia.  Patient lives in an assisted living facility.  Palliative care is consulted to help address goals and manage ongoing symptoms.  SOCIAL HISTORY:     reports that she quit smoking about 51 years ago. Her smoking use included cigarettes. She has never used smokeless tobacco. She reports that she does not drink alcohol and does not use drugs.   Patient is widowed.  She lives in a memory care unit at the [facility].  Patient has 2 daughters who are involved in her care.  Patient worked as an [employer].  Her husband was in ENT.  ADVANCE DIRECTIVES:  Not on file  CODE STATUS: DNR/DNI (DNR form completed on 04/13/2020)  PAST MEDICAL HISTORY: Past Medical History:  Diagnosis Date  . Diabetes mellitus ([place])    diet controlled  . Diabetes mellitus without complication ([place])   . Dry eye syndrome   . Hyperlipidemia, unspecified   . Hypertension   . Inflammatory arthritis   . Osteoporosis   . Postmenopausal     PAST SURGICAL HISTORY:  Past Surgical History:  Procedure Laterality Date  . ABDOMINAL HYSTERECTOMY    . APPENDECTOMY    . CAPSULOTOMY     OS  . CATARACT EXTRACTION  W/PHACO Right 08/2008   crystalens 24.0D  . CATARACT EXTRACTION [facility] Left 01/18/2009   Crystalens 24.5D    HEMATOLOGY/ONCOLOGY HISTORY:  Oncology History   No history exists.    ALLERGIES:  is allergic to epinephrine, latex, levofloxacin, and other.  MEDICATIONS:  Current Outpatient Medications  Medication Sig Dispense Refill  . acetaminophen (TYLENOL ARTHRITIS PAIN) 650 MG CR tablet Take 1,300 mg by mouth 2 (two) times daily.     Marland Kitchen atorvastatin (LIPITOR) 10 MG tablet Take 10 mg by mouth daily.    Marland Kitchen dexamethasone (DECADRON) 0.5 MG/5ML solution Take 0.5 mg by mouth See admin instructions. Rinse 72ml orally for 1 minute and spit out 4 times daily as needed    . enalapril (VASOTEC) 20 MG tablet Take 20 mg by mouth 2 (two) times daily.     Marland Kitchen levothyroxine (SYNTHROID, LEVOTHROID) 50 MCG tablet Take 50 mcg by mouth daily before breakfast.     . magnesium hydroxide (MILK OF MAGNESIA) 400 MG/5ML suspension Take 30 mLs by mouth daily as needed for moderate constipation.     . metFORMIN (GLUCOPHAGE) 500 MG tablet Take 500 mg by mouth 2 (two) times daily with a meal.     . potassium chloride (K-DUR,KLOR-CON) 10 MEQ tablet Take 20 mEq by mouth daily.     . rivastigmine (EXELON) 9.5 mg/24hr Place 9.5 mg onto the skin daily. 0745    . sucralfate (CARAFATE) 1 g tablet Take 1 tablet (1 g total) by mouth 3 (three) times daily. Dissolve  in 3-4 tbsp warm water, swish and swallow. 90 tablet 1  . traMADol (ULTRAM) 50 MG tablet Take 2 tablets (100 mg total) by mouth 3 (three) times daily. 90 tablet 0   No current facility-administered medications for this visit.    VITAL SIGNS: There were no vitals taken for this visit. There were no vitals filed for this visit.  Estimated body mass index is 20.19 kg/m as calculated from the following:   Height as of 02/04/20: 5\' 3"  (1.6 m).   Weight as of an earlier encounter on 04/13/20: 114 lb (51.7 kg).  LABS: CBC:    Component Value Date/Time   WBC 6.8  03/27/2020 1030   HGB 11.4 (L) 03/27/2020 1030   HCT 34.7 (L) 03/27/2020 1030   PLT 266 03/27/2020 1030   MCV 92.8 03/27/2020 1030   NEUTROABS 8.1 (H) 02/04/2020 1510   LYMPHSABS 0.8 02/04/2020 1510   MONOABS 0.6 02/04/2020 1510   EOSABS 0.0 02/04/2020 1510   BASOSABS 0.0 02/04/2020 1510   Comprehensive Metabolic Panel:    Component Value Date/Time   NA 138 03/27/2020 1030   K 4.2 03/27/2020 1030   CL 102 03/27/2020 1030   CO2 27 03/27/2020 1030   BUN 18 03/27/2020 1030   CREATININE 0.60 03/27/2020 1030   GLUCOSE 107 (H) 03/27/2020 1030   CALCIUM 9.6 03/27/2020 1030   AST 17 03/27/2020 1030   ALT 12 03/27/2020 1030   ALKPHOS 63 03/27/2020 1030   BILITOT 0.7 03/27/2020 1030   PROT 6.6 03/27/2020 1030   ALBUMIN 3.8 03/27/2020 1030    RADIOGRAPHIC STUDIES: No results found.  PERFORMANCE STATUS (ECOG) : 2 - Symptomatic, <50% confined to bed  Review of Systems Unless otherwise noted, a complete review of systems is negative.  Physical Exam General: NAD, thin, frail-appearing Pulmonary: Unlabored Extremities: no edema, no joint deformities Skin: no rashes Neurological: Weakness, confusion  IMPRESSION: Met with patient and daughter today in the clinic.  Patient's other daughter, Malachy Mood, participated via phone.  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  Family has decided not to pursue further work-up or treatment.  They are interested in keeping her comfortable at ALF.  We discussed hospice involvement in detail and family would like to proceed with hospice referral.  Symptomatically, patient has had persistent oral pain.  She has been treated with Carafate and dexamethasone rinse without significant improvement.  She is taking tramadol 50 mg 3 times daily.  She also has as needed dosing in addition to scheduled tramadol.  Tramadol helps some.  Family report the patient has previously tried oxycodone but became delirious.  We will plan to increase  scheduled dose of tramadol to 100 mg 3 times daily.  Daughter requested that I reviewed patient's MAR and discontinue unneeded medications.  Family were in agreement with discontinuing patient's atorvastatin, Metformin, and Exelon patch.   We discussed CODE STATUS.  Patient was in agreement with DNR/DNI.  I completed a DNR order for patient to take back with her to the facility.  PLAN: -Best supportive care -Hospice referral called to [facility] -DNR/DNI -Increase tramadol 100mg  3 times daily (Rx for #90) -Discontinue Metformin, atorvastatin, and Exelon patch -RTC as needed  Case and plan discussed with Dr. Grayland Ormond   Patient expressed understanding and was in agreement with this plan. She also understands that She can call the clinic at any time with any questions, concerns, or complaints.     Time Total: 30 minutes  Visit consisted of  counseling and education dealing with the complex and emotionally intense issues of symptom management and palliative care in the setting of serious and potentially life-threatening illness.Greater than 50%  of this time was spent counseling and coordinating care related to the above assessment and plan.  Signed by: Altha Harm, PhD, NP-C

## 2020-04-13 NOTE — Progress Notes (Signed)
Patient here for follow up complains that her mouth is still hurting and she is having trouble chewing and swallowing. Daughter reports that she has lost approx 30 pounds.

## 2020-04-15 NOTE — Progress Notes (Signed)
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  INTERVAL HISTORY: Patient returns to clinic today as an add-on for further evaluation after noted recurrence of her malignancy.  Much of the history is given by her daughter given patient's underlying dementia.  She is in significant pain.  She also has a poor appetite and significant weight loss over the past several weeks to months. She has no neurologic complaints.  She denies any recent fevers or illnesses. She has no chest pain, shortness of breath, cough, or hemoptysis.  She denies any nausea, vomiting, constipation, or diarrhea.  She has no urinary complaints.  Patient offers no further specific complaints today.  REVIEW OF SYSTEMS:   Review of Systems  Constitutional: Positive for weight loss. Negative for fever and malaise/fatigue.  HENT:       Mouth pain  Respiratory: Negative.  Negative for cough, hemoptysis and shortness of breath.   Cardiovascular: Negative.  Negative for chest pain and leg swelling.  Gastrointestinal: Negative.  Negative for abdominal pain, blood in stool and melena.  Genitourinary: Negative.  Negative for hematuria.  Musculoskeletal: Negative.   Skin: Negative.  Negative for rash.  Neurological: Negative.  Negative for dizziness, focal weakness, weakness and headaches.  Psychiatric/Behavioral: Positive for memory loss. The patient is not nervous/anxious.     As per HPI. Otherwise, a complete review of systems is negative.  PAST  MEDICAL HISTORY: Past Medical History:  Diagnosis Date  . Diabetes mellitus ([place])    diet controlled  . Diabetes mellitus without complication ([place])   . Dry eye syndrome   . Hyperlipidemia, unspecified   . Hypertension   . Inflammatory arthritis   . Osteoporosis   . Postmenopausal     PAST SURGICAL HISTORY: Past Surgical History:  Procedure Laterality Date  . ABDOMINAL HYSTERECTOMY    . APPENDECTOMY    . CAPSULOTOMY     OS  . CATARACT EXTRACTION W/PHACO Right 08/2008   [facility] 24.0D  . CATARACT EXTRACTION W/PHACO Left 01/18/2009   [place] 24.5D    FAMILY HISTORY: Family History  Problem Relation Age of Onset  . Alzheimer's disease Mother   . Hypertension Father   . Alzheimer's disease Sister   . Alzheimer's disease Sister     ADVANCED DIRECTIVES (Y/N):  N  HEALTH MAINTENANCE: Social History   Tobacco Use  . Smoking status: Former Smoker    Types: Cigarettes    Quit date: 05/26/1968    Years since quitting: 51.9  . Smokeless tobacco: Never Used  Vaping Use  . Vaping Use: Never used  Substance Use Topics  . Alcohol use: No  . Drug use: No     Colonoscopy:  PAP:  Bone density:  Lipid panel:  Allergies  Allergen Reactions  . Epinephrine Other (See Comments)    Other Reaction: Passed out after dental inject  . Latex Other (See Comments)    Other Reaction: angioedema, mouth burns  . Levofloxacin     Other reaction(s): Hallucination  . Other Other (See Comments)    Uncoded Allergy. Allergen: ENVIRONMENTAL ALLERGIES    Current Outpatient Medications  Medication Sig Dispense Refill  . acetaminophen (TYLENOL ARTHRITIS PAIN) 650 MG CR tablet Take 1,300 mg by mouth 2 (two) times daily.     Marland Kitchen atorvastatin (LIPITOR) 10 MG tablet Take 10 mg by mouth daily.    Marland Kitchen dexamethasone (DECADRON) 0.5 MG/5ML solution Take 0.5 mg by mouth See admin instructions. Rinse 56ml orally for 1 minute and spit out 4 times daily as needed    . enalapril (VASOTEC) 20 MG tablet  Take 20 mg by mouth 2 (two) times daily.     Marland Kitchen levothyroxine (SYNTHROID, LEVOTHROID) 50 MCG tablet Take 50 mcg by mouth daily before breakfast.     . magnesium hydroxide (MILK OF MAGNESIA) 400 MG/5ML suspension Take 30 mLs by mouth daily as needed for moderate constipation.     . metFORMIN (GLUCOPHAGE) 500 MG tablet Take 500 mg by mouth 2 (two) times daily with a meal.     . potassium chloride (K-DUR,KLOR-CON) 10 MEQ tablet Take 20 mEq by mouth daily.     . rivastigmine (EXELON) 9.5 mg/24hr Place 9.5 mg onto the skin daily. 0745    . sucralfate (CARAFATE) 1 g tablet Take 1 tablet (1 g total) by mouth 3 (three) times daily. Dissolve in 3-4 tbsp warm water, swish and swallow. 90 tablet 1  . traMADol (ULTRAM) 50 MG tablet Take 2 tablets (100 mg total) by mouth 3 (three) times daily. 90 tablet 0   No current facility-administered medications for this visit.    OBJECTIVE: Vitals:   04/13/20 1346  BP: 113/73  Pulse: (!) 126  Temp: 98.2 F (36.8 C)     Body mass index is 20.19 kg/m.    ECOG FS:1 - Symptomatic but completely ambulatory  General: Well-developed, well-nourished, no acute distress. Eyes: Pink conjunctiva, anicteric sclera. HEENT: Normocephalic, moist mucous membranes.  No palpable lymphadenopathy. Lungs: No audible wheezing or coughing. Heart: Regular rate and rhythm. Abdomen: Soft, nontender, no obvious distention. Musculoskeletal: No edema, cyanosis, or clubbing. Neuro: Alert, answering all questions appropriately. Cranial nerves grossly intact. Skin: No rashes or petechiae noted. Psych: Normal affect.   LAB RESULTS:  Lab Results  Component Value Date   NA 138 03/27/2020   K 4.2 03/27/2020   CL 102 03/27/2020   CO2 27 03/27/2020   GLUCOSE 107 (H) 03/27/2020   BUN 18 03/27/2020   CREATININE 0.60 03/27/2020   CALCIUM 9.6 03/27/2020   PROT 6.6 03/27/2020   ALBUMIN 3.8 03/27/2020   AST 17 03/27/2020   ALT 12 03/27/2020   ALKPHOS 63 03/27/2020   BILITOT 0.7  03/27/2020   GFRNONAA >60 03/27/2020   GFRAA >60 02/05/2020    Lab Results  Component Value Date   WBC 6.8 03/27/2020   NEUTROABS 8.1 (H) 02/04/2020   HGB 11.4 (L) 03/27/2020   HCT 34.7 (L) 03/27/2020   MCV 92.8 03/27/2020   PLT 266 03/27/2020     STUDIES: No results found.  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  PLAN:    1.  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  Given her advanced age and decreased memory, patient previously received XRT only.  No further treatment is recommended.  Patient and daughter have agreed to hospice care.  No further follow-up is necessary.  Appreciate palliative care input.  2.  Dysphagia/weight loss: Multifactorial.  Hospice as above. 3.  Anemia: Mild, monitor.   4.  Pain: Appreciate palliative care input.  Adjust medications as per hospice.  I spent a total of 30 minutes reviewing chart data, face-to-face evaluation with the patient, counseling and coordination of care as detailed above.   Patient expressed understanding and was in agreement with this plan. She also understands that She can call clinic at any time with any questions, concerns, or complaints.   Cancer Staging No matching staging information was found for the patient.  Lloyd Huger, MD   04/15/2020 9:44 AM

## 2020-05-01 ENCOUNTER — Ambulatory Visit: Payer: Medicare Other | Admitting: Oncology

## 2020-06-26 DEATH — deceased

## 2020-06-27 ENCOUNTER — Telehealth: Payer: Self-pay | Admitting: Radiation Oncology

## 2020-06-27 NOTE — Telephone Encounter (Signed)
Pt's daughter Malachy Mood) left message stating that pt passed away on 19-Jun-2020 and that all appts need to cancelled.

## 2020-07-02 ENCOUNTER — Ambulatory Visit: Payer: Medicare Other | Admitting: Radiation Oncology

## 2022-04-01 IMAGING — PT NM PET TUM IMG INITIAL (PI) SKULL BASE T - THIGH
8 series · 24 of 25 positions shown · non-contrast
Comparison: Abdomen/pelvis CT 07/15/2019.

CLINICAL DATA: Initial treatment strategy for squamous cell
carcinoma of the head and neck.

EXAM:
NUCLEAR MEDICINE PET SKULL BASE TO THIGH
TECHNIQUE: 7.3 mCi F-18 FDG was injected intravenously. Full-ring PET imaging
was performed from the skull base to thigh after the radiotracer. CT
data was obtained and used for attenuation correction and anatomic
localization.
Fasting blood glucose: 109 mg/dl

[Series 3: ct wb 5.0 b30f · axial · 5.0mm · 0.98mm/px · z∈[-800,-50]mm · 3 of 251 slices shown]
[im 1/251]
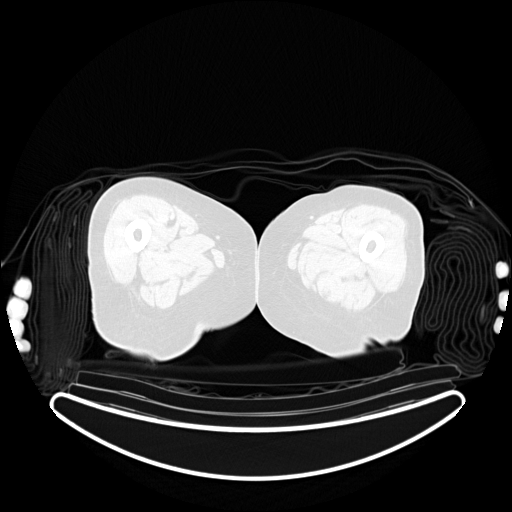
[im 126/251]
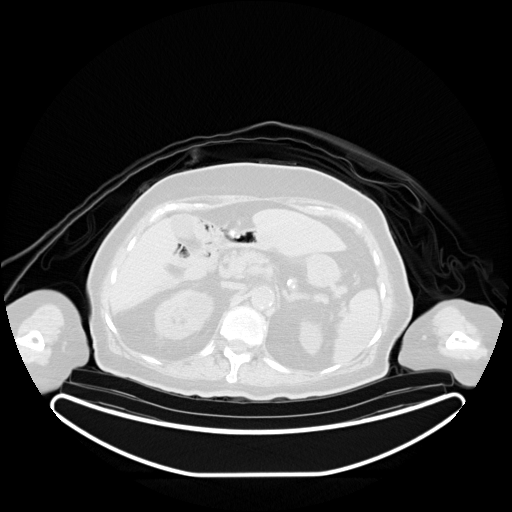
[im 251/251  brain]
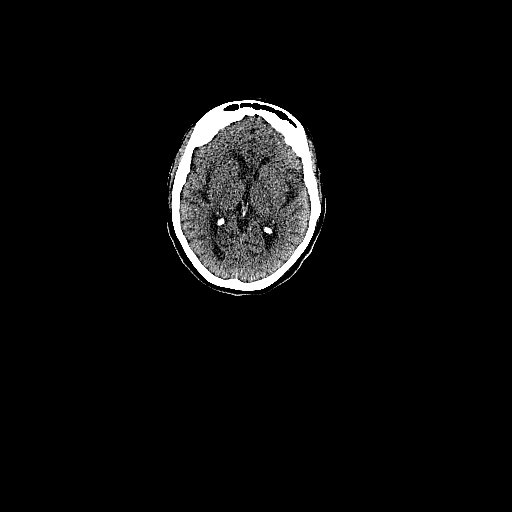

[Series 5: pet wb uncorrected (nac) · axial · 5.0mm · 4.07mm/px · z∈[-800,-50]mm · 3 of 251 slices shown]
[im 1/251]
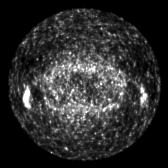
[im 126/251]
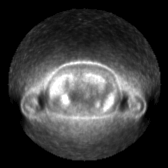
[im 251/251]
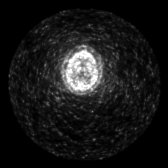

[Series 6: pet wb (ac) · axial · 5.0mm · 3.13mm/px · z∈[-800,-50]mm · 4 of 251 slices shown]
[im 1/251]
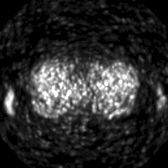
[im 84/251]
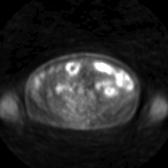
[im 167/251]
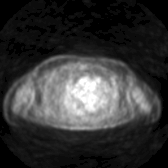
[im 251/251]
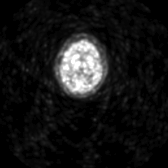

[Series 603: pet axial fused · 3 of 250 slices shown]
[im 1/250]
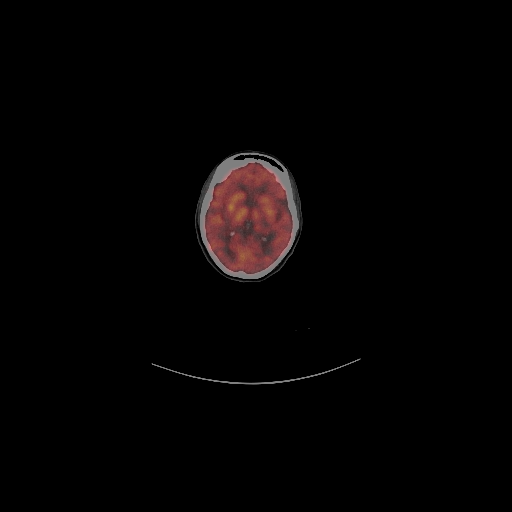
[im 84/250]
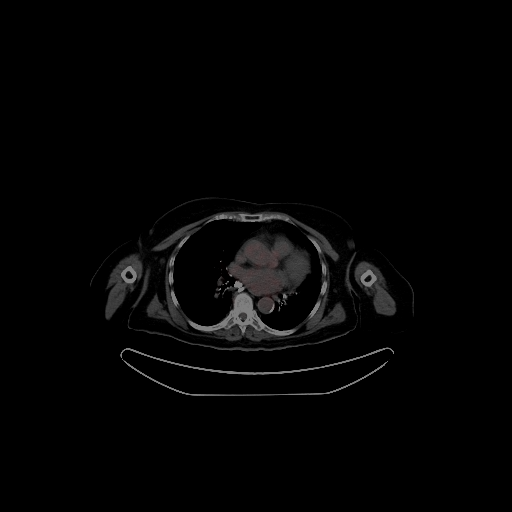
[im 250/250]
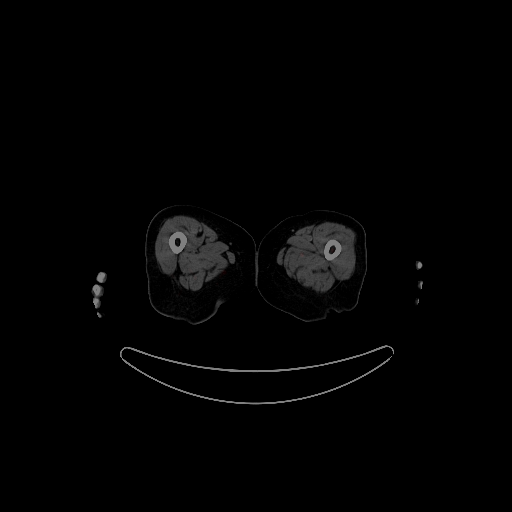

[Series 605: pet sagittal fused · 2 of 160 slices shown]
[im 1/160]
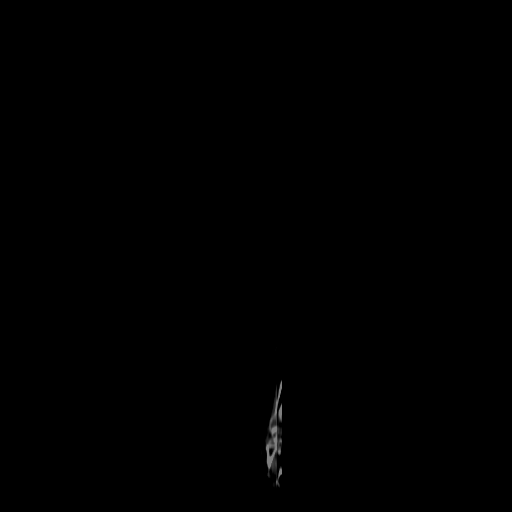
[im 160/160]
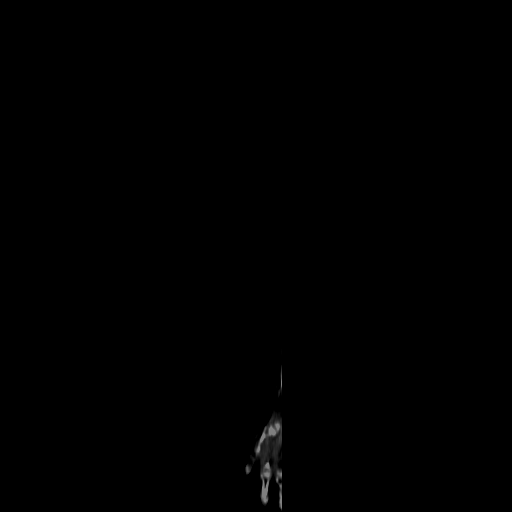

[Series 606: pet axial · 4 of 249 slices shown]
[im 1/249]
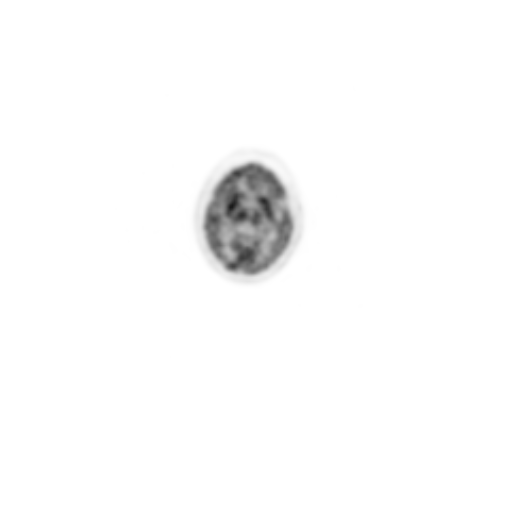
[im 83/249]
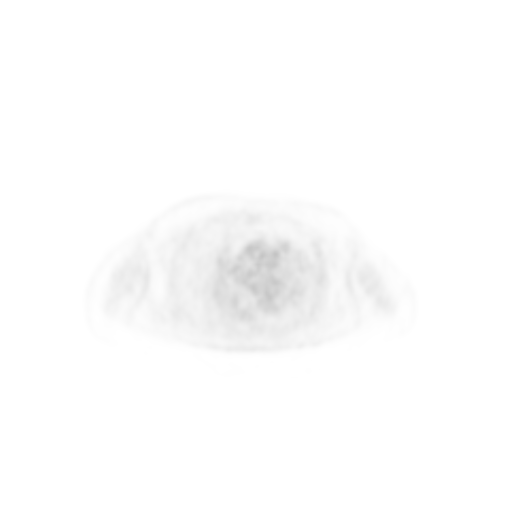
[im 166/249]
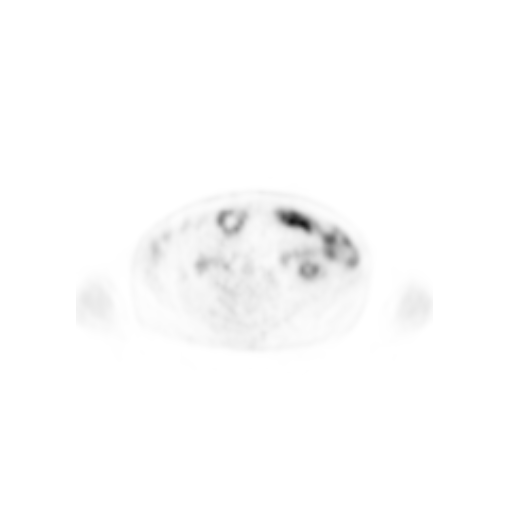
[im 249/249]
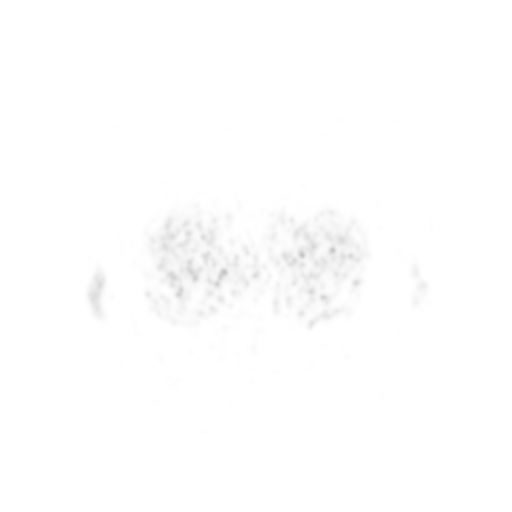

[Series 607: pet coronal · 2 of 115 slices shown]
[im 1/115]
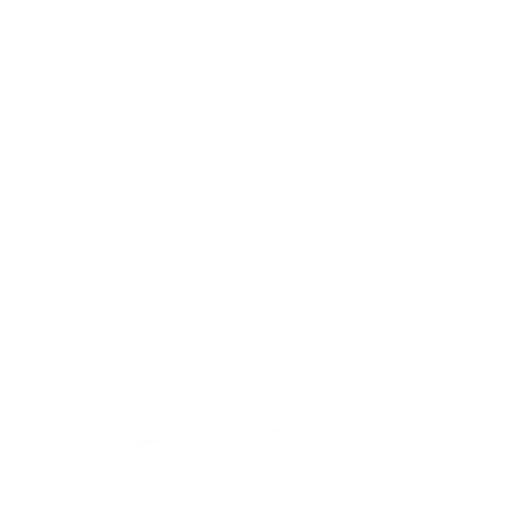
[im 115/115]
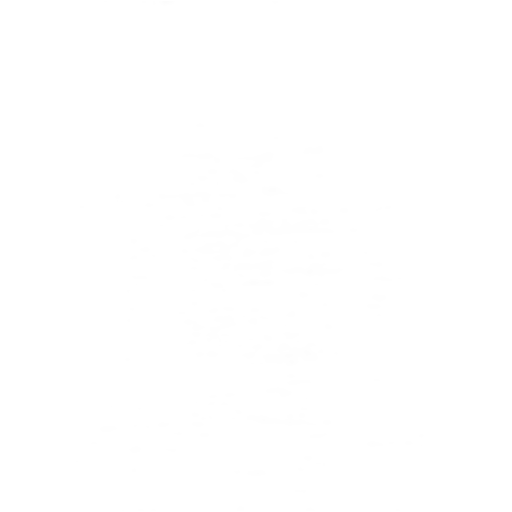

[Series 608: pet sagittal · 3 of 175 slices shown]
[im 1/175]
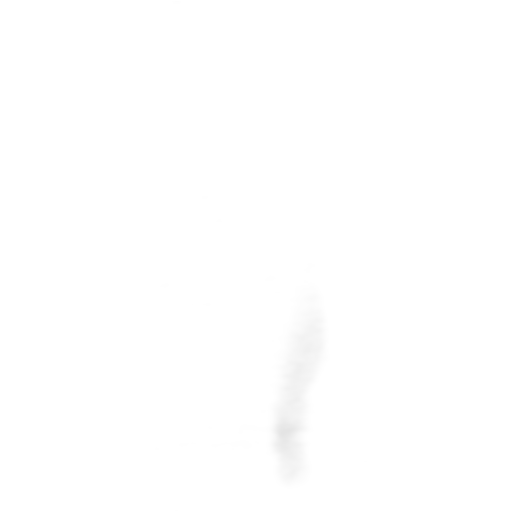
[im 88/175]
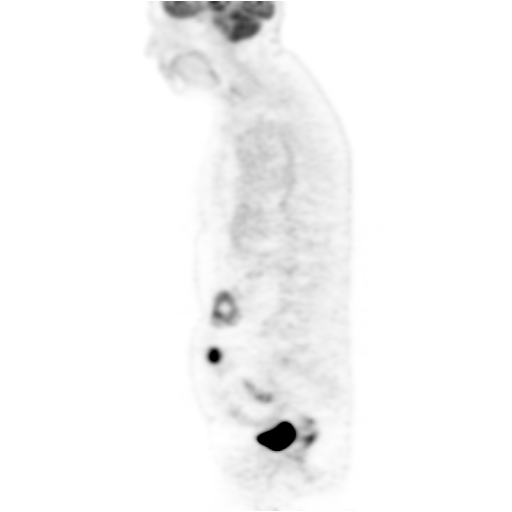
[im 175/175]
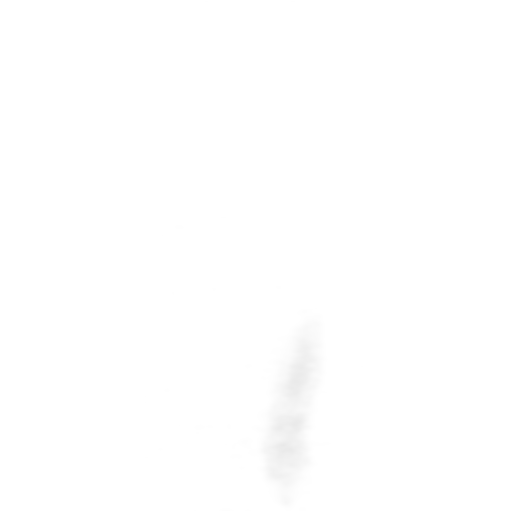

[24 of 25 positions shown; findings below may reference images not displayed]

FINDINGS: Mediastinal blood pool activity: SUV max

Liver activity: SUV max NA

NECK: Focal hypermetabolism identified along the body of the right
mandible with SUV max = 8. No hypermetabolic cervical
lymphadenopathy.

Incidental CT findings: 1.8 cm left thyroid nodule is stable since
11/06/2016 and shows no hypermetabolism. No follow-up recommended.
(Ref: [HOSPITAL]. [DATE]): 143-50).

CHEST: No hypermetabolic mediastinal or hilar nodes. 1.7 cm left
lower lobe pulmonary nodule identified on image 74/3 is stable since
CTA chest 11/06/2016 in shows no hypermetabolism on PET imaging.

Incidental CT findings: Heart size upper normal to mildly increased.
Coronary artery calcification is evident. Atherosclerotic
calcification is noted in the wall of the thoracic aorta. Calcified
nodal tissue identified in the right hilum. Chronic interstitial
changes with subpleural reticulation suggests component of
underlying fibrotic lung disease.

ABDOMEN/PELVIS: No abnormal hypermetabolic activity within the
liver, pancreas, adrenal glands, or spleen. No hypermetabolic lymph
nodes in the abdomen or pelvis.

Incidental CT findings: 4.1 cm cyst noted upper pole right kidney.
There is abdominal aortic atherosclerosis without aneurysm.
Diverticular changes noted left colon.

SKELETON: No focal hypermetabolic activity to suggest skeletal
metastasis.

Incidental CT findings: No worrisome lytic or sclerotic osseous
abnormality.
IMPRESSION: 1. Focal hypermetabolism identified in the right mouth, along the
body of the mandible, compatible with the patient's known primary
neoplasm. No evidence for hypermetabolic metastatic disease in the
neck, chest, abdomen, or pelvis.
[DATE] cm left lower lobe pulmonary nodule is stable since
11/06/2016 in that exam documented only minimal change since 8088.
There is no hypermetabolism in this nodule on today's study. Imaging
features most consistent with a benign etiology.
3. Chronic interstitial changes in the lungs with subpleural
reticulation. Component of underlying fibrotic lung disease a
concern.
4.  Aortic Atherosclerois ([id number]-170.0)
